# Patient Record
Sex: Male | Born: 1955
Health system: Southern US, Community
[De-identification: ages and names within clinical notes are randomized; demographics above are authoritative.]

## PROBLEM LIST (undated history)

## (undated) DIAGNOSIS — I10 Essential (primary) hypertension: Secondary | ICD-10-CM

## (undated) DIAGNOSIS — M1990 Unspecified osteoarthritis, unspecified site: Secondary | ICD-10-CM

## (undated) HISTORY — DX: Unspecified osteoarthritis, unspecified site: M19.90

## (undated) HISTORY — DX: Essential (primary) hypertension: I10

---
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
## 2006-09-13 ENCOUNTER — Inpatient Hospital Stay: Payer: Self-pay | Admitting: Internal Medicine

## 2008-07-10 ENCOUNTER — Ambulatory Visit: Payer: Self-pay | Admitting: Internal Medicine

## 2012-02-23 HISTORY — PX: COLONOSCOPY: SHX5424

## 2014-10-07 ENCOUNTER — Other Ambulatory Visit: Payer: Self-pay

## 2014-11-30 ENCOUNTER — Other Ambulatory Visit: Payer: Self-pay | Admitting: Family Medicine

## 2015-04-04 ENCOUNTER — Encounter: Payer: Self-pay | Admitting: Family Medicine

## 2015-04-04 ENCOUNTER — Ambulatory Visit (INDEPENDENT_AMBULATORY_CARE_PROVIDER_SITE_OTHER): Payer: Self-pay | Admitting: Family Medicine

## 2015-04-04 VITALS — BP 138/88 | HR 76 | Ht 68.0 in | Wt 263.0 lb

## 2015-04-04 DIAGNOSIS — I10 Essential (primary) hypertension: Secondary | ICD-10-CM

## 2015-04-04 DIAGNOSIS — M150 Primary generalized (osteo)arthritis: Secondary | ICD-10-CM

## 2015-04-04 DIAGNOSIS — M159 Polyosteoarthritis, unspecified: Secondary | ICD-10-CM

## 2015-04-04 MED ORDER — METOPROLOL SUCCINATE ER 50 MG PO TB24: ORAL_TABLET | ORAL | Status: DC

## 2015-04-04 MED ORDER — MELOXICAM 7.5 MG PO TABS: 7.5000 mg | ORAL_TABLET | Freq: Every day | ORAL | Status: DC

## 2015-04-04 NOTE — Progress Notes (Signed)
Name: Brandon Perez   MRN: [id number]    DOB: [birth year]   Date:04/04/2015       Progress Note  Subjective  Chief Complaint  Chief Complaint  Patient presents with  . Hypertension  . Arthritis    told by ortho- has arthritis in knees- refill Meloxicam    Hypertension This is a chronic problem. The current episode started more than 1 year ago. The problem has been waxing and waning since onset. The problem is controlled. Pertinent negatives include no anxiety, blurred vision, chest pain, headaches, malaise/fatigue, neck pain, orthopnea, palpitations, peripheral edema, PND, shortness of breath or sweats. There are no associated agents to hypertension. There are no known risk factors for coronary artery disease. Past treatments include beta blockers. The current treatment provides no improvement. There are no compliance problems.  There is no history of angina, kidney disease, CAD/MI, CVA, heart failure, left ventricular hypertrophy, PVD, renovascular disease or retinopathy. There is no history of chronic renal disease or a hypertension causing med.  Arthritis Presents for follow-up visit. He complains of pain. He reports no stiffness, joint swelling or joint warmth. The symptoms have been improving. Affected locations include the left knee and right knee. His pain is at a severity of 5/10. Pertinent negatives include no diarrhea, dry eyes, dry mouth, dysuria, fatigue, fever, pain at night, pain while resting, rash, Raynaud's syndrome, uveitis or weight loss.    No problem-specific assessment & plan notes found for this encounter.   Past Medical History  Diagnosis Date  . Hypertension   . Arthritis     Past Surgical History  Procedure Laterality Date  . Colonoscopy  02/23/2012    normal    Family History  Problem Relation Age of Onset  . Heart disease Mother   . Heart disease Father   . Cancer Sister   . Hypertension Brother     Social History   Social History  . Marital  Status: Married    Spouse Name: N/A  . Number of Children: N/A  . Years of Education: N/A   Occupational History  . Not on file.   Social History Main Topics  . Smoking status: Current Every Day Smoker  . Smokeless tobacco: Not on file  . Alcohol Use: No  . Drug Use: No  . Sexual Activity: Yes   Other Topics Concern  . Not on file   Social History Narrative  . No narrative on file    Allergies  Allergen Reactions  . Penicillin G Other (See Comments)    As a child     Review of Systems  Constitutional: Negative for fever, chills, weight loss, malaise/fatigue and fatigue.  HENT: Negative for ear discharge, ear pain and sore throat.   Eyes: Negative for blurred vision.  Respiratory: Negative for cough, sputum production, shortness of breath and wheezing.   Cardiovascular: Negative for chest pain, palpitations, orthopnea, leg swelling and PND.  Gastrointestinal: Negative for heartburn, nausea, abdominal pain, diarrhea, constipation, blood in stool and melena.  Genitourinary: Negative for dysuria, urgency, frequency and hematuria.  Musculoskeletal: Positive for arthritis. Negative for myalgias, back pain, joint pain, joint swelling, stiffness and neck pain.  Skin: Negative for rash.  Neurological: Negative for dizziness, tingling, sensory change, focal weakness and headaches.  Endo/Heme/Allergies: Negative for environmental allergies and polydipsia. Does not bruise/bleed easily.  Psychiatric/Behavioral: Negative for depression and suicidal ideas. The patient is not nervous/anxious and does not have insomnia.      Objective  Filed Vitals:  04/04/15 0902  BP: 138/88  Pulse: 76  Height:  (1.727 m)  Weight: 263 lb (119.296 kg)    Physical Exam  Constitutional: He is oriented to person, place, and time and well-developed, well-nourished, and in no distress.  HENT:  Head: Normocephalic.  Right Ear: External ear normal.  Left Ear: External ear normal.  Nose:  Nose normal.  Mouth/Throat: Oropharynx is clear and moist.  Eyes: Conjunctivae and EOM are normal. Pupils are equal, round, and reactive to light. Right eye exhibits no discharge. Left eye exhibits no discharge. No scleral icterus.  Neck: Normal range of motion. Neck supple. No JVD present. No tracheal deviation present. No thyromegaly present.  Cardiovascular: Normal rate, regular rhythm, normal heart sounds and intact distal pulses.  Exam reveals no gallop and no friction rub.   No murmur heard. Pulmonary/Chest: Breath sounds normal. No respiratory distress. He has no wheezes. He has no rales.  Abdominal: Soft. Bowel sounds are normal. He exhibits no mass. There is no hepatosplenomegaly. There is no tenderness. There is no rebound, no guarding and no CVA tenderness.  Musculoskeletal: Normal range of motion. He exhibits no edema or tenderness.  Lymphadenopathy:    He has no cervical adenopathy.  Neurological: He is alert and oriented to person, place, and time. He has normal sensation, normal strength, normal reflexes and intact cranial nerves. No cranial nerve deficit.  Skin: Skin is warm. No rash noted.  Psychiatric: Mood and affect normal.  Nursing note and vitals reviewed.     Assessment & Plan  Problem List Items Addressed This Visit    None    Visit Diagnoses    Essential hypertension    -  Primary    Relevant Medications    aspirin 81 MG tablet    metoprolol succinate (TOPROL-XL) 50 MG 24 hr tablet    Primary osteoarthritis involving multiple joints        Relevant Medications    aspirin 81 MG tablet    meloxicam (MOBIC) 7.5 MG tablet         Dr. Hayden [facility]  04/04/2015

## 2016-03-29 ENCOUNTER — Encounter: Payer: Self-pay | Admitting: Family Medicine

## 2016-03-29 ENCOUNTER — Ambulatory Visit (INDEPENDENT_AMBULATORY_CARE_PROVIDER_SITE_OTHER): Payer: Self-pay | Admitting: Family Medicine

## 2016-03-29 VITALS — BP 130/88 | HR 80 | Ht 68.0 in | Wt 271.0 lb

## 2016-03-29 DIAGNOSIS — I10 Essential (primary) hypertension: Secondary | ICD-10-CM

## 2016-03-29 MED ORDER — METOPROLOL SUCCINATE ER 50 MG PO TB24: ORAL_TABLET | ORAL | 3 refills | Status: DC

## 2016-03-29 NOTE — Progress Notes (Signed)
Name: Brandon Perez   MRN: [id number]    DOB: 07/18/1955   Date:03/29/2016       Progress Note  Subjective  Chief Complaint  Chief Complaint  Patient presents with  . Hypertension    Hypertension  This is a chronic problem. The current episode started more than 1 year ago. The problem has been waxing and waning since onset. The problem is controlled. Pertinent negatives include no anxiety, blurred vision, chest pain, headaches, malaise/fatigue, neck pain, orthopnea, palpitations, peripheral edema, PND, shortness of breath or sweats. There are no associated agents to hypertension.    No problem-specific Assessment & Plan notes found for this encounter.   Past Medical History:  Diagnosis Date  . Arthritis   . Hypertension     Past Surgical History:  Procedure Laterality Date  . COLONOSCOPY  02/23/2012   normal    Family History  Problem Relation Age of Onset  . Heart disease Mother   . Heart disease Father   . Cancer Sister   . Hypertension Brother     Social History   Social History  . Marital status: Married    Spouse name: N/A  . Number of children: N/A  . Years of education: N/A   Occupational History  . Not on file.   Social History Main Topics  . Smoking status: Current Every Day Smoker  . Smokeless tobacco: Not on file  . Alcohol use No  . Drug use: No  . Sexual activity: Yes   Other Topics Concern  . Not on file   Social History Narrative  . No narrative on file    Allergies  Allergen Reactions  . Penicillin G Other (See Comments)    As a child     Review of Systems  Constitutional: Negative for chills, fever, malaise/fatigue and weight loss.  HENT: Negative for ear discharge, ear pain and sore throat.   Eyes: Negative for blurred vision.  Respiratory: Negative for cough, sputum production, shortness of breath and wheezing.   Cardiovascular: Negative for chest pain, palpitations, orthopnea, leg swelling and PND.  Gastrointestinal: Negative  for abdominal pain, blood in stool, constipation, diarrhea, heartburn, melena and nausea.  Genitourinary: Negative for dysuria, frequency, hematuria and urgency.  Musculoskeletal: Negative for back pain, joint pain, myalgias and neck pain.  Skin: Negative for rash.  Neurological: Negative for dizziness, tingling, sensory change, focal weakness and headaches.  Endo/Heme/Allergies: Negative for environmental allergies and polydipsia. Does not bruise/bleed easily.  Psychiatric/Behavioral: Negative for depression and suicidal ideas. The patient is not nervous/anxious and does not have insomnia.      Objective  Vitals:   03/29/16 0903  BP: 130/88  Pulse: 80  Weight: 271 lb (122.9 kg)  Height: 5\' 8"  (1.727 m)    Physical Exam  Constitutional: He is oriented to person, place, and time and well-developed, well-nourished, and in no distress.  HENT:  Head: Normocephalic.  Right Ear: External ear normal.  Left Ear: External ear normal.  Nose: Nose normal.  Mouth/Throat: Oropharynx is clear and moist.  Eyes: Conjunctivae and EOM are normal. Pupils are equal, round, and reactive to light. Right eye exhibits no discharge. Left eye exhibits no discharge. No scleral icterus.  Neck: Normal range of motion. Neck supple. No JVD present. No tracheal deviation present. No thyromegaly present.  Cardiovascular: Normal rate, regular rhythm, normal heart sounds and intact distal pulses.  Exam reveals no gallop and no friction rub.   No murmur heard. Pulmonary/Chest: Breath sounds normal. No  respiratory distress. He has no wheezes. He has no rales.  Abdominal: Soft. Bowel sounds are normal. He exhibits no mass. There is no hepatosplenomegaly. There is no tenderness. There is no rebound, no guarding and no CVA tenderness.  Musculoskeletal: Normal range of motion. He exhibits no edema or tenderness.  Lymphadenopathy:    He has no cervical adenopathy.  Neurological: He is alert and oriented to person, place,  and time. He has normal sensation, normal strength, normal reflexes and intact cranial nerves. No cranial nerve deficit.  Skin: Skin is warm. No rash noted.  Psychiatric: Mood and affect normal.      Assessment & Plan  Problem List Items Addressed This Visit    None    Visit Diagnoses    Essential hypertension    -  Primary   Relevant Medications   metoprolol succinate (TOPROL-XL) 50 MG 24 hr tablet   Other Relevant Orders   Renal Function Panel        Dr. Elizabeth Sauer [facility]  03/29/16

## 2018-09-08 ENCOUNTER — Other Ambulatory Visit: Payer: Self-pay

## 2018-09-08 ENCOUNTER — Ambulatory Visit (INDEPENDENT_AMBULATORY_CARE_PROVIDER_SITE_OTHER): Payer: Medicare PPO | Admitting: Family Medicine

## 2018-09-08 ENCOUNTER — Encounter: Payer: Self-pay | Admitting: Family Medicine

## 2018-09-08 VITALS — BP 134/76 | HR 65 | Ht 68.0 in | Wt 263.0 lb

## 2018-09-08 DIAGNOSIS — I10 Essential (primary) hypertension: Secondary | ICD-10-CM | POA: Diagnosis not present

## 2018-09-08 MED ORDER — LISINOPRIL 10 MG PO TABS: 10.0000 mg | ORAL_TABLET | Freq: Every day | ORAL | 1 refills | Status: DC

## 2018-09-08 NOTE — Progress Notes (Signed)
Date:  09/08/2018   Name:  Brandon Perez   DOB:  07-Dec-1955   MRN:  [id number]   Chief Complaint: Hypertension  Hypertension This is a chronic problem. The current episode started more than 1 year ago. The problem is controlled. Pertinent negatives include no anxiety, blurred vision, chest pain, headaches, malaise/fatigue, neck pain, orthopnea, palpitations, peripheral edema, PND, shortness of breath or sweats. There are no associated agents to hypertension. Risk factors for coronary artery disease include obesity and smoking/tobacco exposure. Past treatments include beta blockers. The current treatment provides moderate improvement. There are no compliance problems.  There is no history of angina, kidney disease, CAD/MI, CVA, heart failure, left ventricular hypertrophy, PVD or retinopathy. There is no history of chronic renal disease, a hypertension causing med or renovascular disease.    Review of Systems  Constitutional: Negative for chills, fever and malaise/fatigue.  HENT: Negative for drooling, ear discharge, ear pain and sore throat.   Eyes: Negative for blurred vision.  Respiratory: Negative for cough, shortness of breath and wheezing.   Cardiovascular: Negative for chest pain, palpitations, orthopnea, leg swelling and PND.  Gastrointestinal: Negative for abdominal pain, blood in stool, constipation, diarrhea and nausea.  Endocrine: Negative for polydipsia.  Genitourinary: Negative for dysuria, frequency, hematuria and urgency.  Musculoskeletal: Negative for back pain, myalgias and neck pain.  Skin: Negative for rash.  Allergic/Immunologic: Negative for environmental allergies.  Neurological: Negative for dizziness and headaches.  Hematological: Does not bruise/bleed easily.  Psychiatric/Behavioral: Negative for suicidal ideas. The patient is not nervous/anxious.     There are no active problems to display for this patient.   Allergies  Allergen Reactions  . Penicillin G  Other (See Comments)    As a child    Past Surgical History:  Procedure Laterality Date  . COLONOSCOPY  02/23/2012   normal    Social History   Tobacco Use  . Smoking status: Current Every Day Smoker  . Smokeless tobacco: Never Used  Substance Use Topics  . Alcohol use: No    Alcohol/week: 0.0 standard drinks  . Drug use: No     Medication list has been reviewed and updated.  Current Meds  Medication Sig  . aspirin 81 MG tablet Take 81 mg by mouth daily.  . metoprolol succinate (TOPROL-XL) 50 MG 24 hr tablet Take 1 tablet by mouth  every day    PHQ 2/9 Scores 09/08/2018 04/04/2015  PHQ - 2 Score 0 0  PHQ- 9 Score 0 -    BP Readings from Last 3 Encounters:  09/08/18 134/76  03/29/16 130/88  04/04/15 138/88    Physical Exam Vitals signs and nursing note reviewed.  HENT:     Head: Normocephalic.     Right Ear: External ear normal.     Left Ear: External ear normal.     Nose: Nose normal.  Eyes:     General: No scleral icterus.       Right eye: No discharge.        Left eye: No discharge.     Conjunctiva/sclera: Conjunctivae normal.     Pupils: Pupils are equal, round, and reactive to light.  Neck:     Musculoskeletal: Normal range of motion and neck supple.     Thyroid: No thyromegaly.     Vascular: No JVD.     Trachea: No tracheal deviation.  Cardiovascular:     Rate and Rhythm: Normal rate and regular rhythm.     Heart sounds: Normal heart  sounds. No murmur. No friction rub. No gallop.   Pulmonary:     Effort: No respiratory distress.     Breath sounds: Normal breath sounds. No wheezing or rales.  Abdominal:     General: Bowel sounds are normal.     Palpations: Abdomen is soft. There is no mass.     Tenderness: There is no abdominal tenderness. There is no guarding or rebound.  Musculoskeletal: Normal range of motion.        General: No tenderness.  Lymphadenopathy:     Cervical: No cervical adenopathy.  Skin:    General: Skin is warm.      Findings: No rash.  Neurological:     Mental Status: He is alert and oriented to person, place, and time.     Cranial Nerves: No cranial nerve deficit.     Deep Tendon Reflexes: Reflexes are normal and symmetric.     Wt Readings from Last 3 Encounters:  09/08/18 263 lb (119.3 kg)  03/29/16 271 lb (122.9 kg)  04/04/15 263 lb (119.3 kg)    BP 134/76   Pulse 65   Ht 5\' 8"  (1.727 m)   Wt 263 lb (119.3 kg)   SpO2 93%   BMI 39.99 kg/m   Assessment and Plan: 1. Essential hypertension Chronic.  Controlled.  But patient has not felt well on current dosing of metoprolol and has resorted to taking it every other day because of that.  We will discontinue metoprolol and replace it with lisinopril 10 mg.  Will recheck patient in 6 months in the meantime we will check a renal function panel. - Renal Function Panel - lisinopril (ZESTRIL) 10 MG tablet; Take 1 tablet (10 mg total) by mouth daily.  Dispense: 90 tablet; Refill: 1

## 2018-09-08 NOTE — Patient Instructions (Signed)

## 2018-09-09 LAB — RENAL FUNCTION PANEL
Albumin: 4.1 g/dL (ref 3.8–4.8)
BUN/Creatinine Ratio: 14 (ref 10–24)
BUN: 14 mg/dL (ref 8–27)
CO2: 22 mmol/L (ref 20–29)
Calcium: 9.1 mg/dL (ref 8.6–10.2)
Chloride: 100 mmol/L (ref 96–106)
Creatinine, Ser: 1 mg/dL (ref 0.76–1.27)
GFR calc Af Amer: 91 mL/min/{1.73_m2} (ref >59)
GFR calc non Af Amer: 79 mL/min/{1.73_m2} (ref >59)
Glucose: 81 mg/dL (ref 65–99)
Phosphorus: 3.5 mg/dL (ref 2.8–4.1)
Potassium: 4.4 mmol/L (ref 3.5–5.2)
Sodium: 139 mmol/L (ref 134–144)

## 2019-03-14 ENCOUNTER — Encounter: Payer: Self-pay | Admitting: Family Medicine

## 2019-03-14 ENCOUNTER — Ambulatory Visit: Payer: Medicare PPO | Admitting: Family Medicine

## 2019-03-14 ENCOUNTER — Other Ambulatory Visit: Payer: Self-pay

## 2019-03-14 VITALS — BP 120/80 | HR 72 | Ht 68.0 in | Wt 257.0 lb

## 2019-03-14 DIAGNOSIS — Z23 Encounter for immunization: Secondary | ICD-10-CM | POA: Diagnosis not present

## 2019-03-14 DIAGNOSIS — I10 Essential (primary) hypertension: Secondary | ICD-10-CM | POA: Diagnosis not present

## 2019-03-14 DIAGNOSIS — E6609 Other obesity due to excess calories: Secondary | ICD-10-CM

## 2019-03-14 DIAGNOSIS — Z6839 Body mass index (BMI) 39.0-39.9, adult: Secondary | ICD-10-CM

## 2019-03-14 MED ORDER — LISINOPRIL 10 MG PO TABS: 10.0000 mg | ORAL_TABLET | Freq: Every day | ORAL | 1 refills | Status: DC

## 2019-03-14 NOTE — Patient Instructions (Signed)

## 2019-03-14 NOTE — Progress Notes (Signed)
Date:  03/14/2019   Name:  Brandon Perez   DOB:  20-Jan-1956   MRN:  [id number]   Chief Complaint: Hypertension and Flu Vaccine  Hypertension This is a chronic problem. The current episode started more than 1 year ago. The problem has been gradually improving since onset. The problem is controlled. Pertinent negatives include no anxiety, blurred vision, chest pain, headaches, malaise/fatigue, neck pain, orthopnea, palpitations, peripheral edema, PND, shortness of breath or sweats. There are no associated agents to hypertension. Risk factors for coronary artery disease include obesity. Past treatments include ACE inhibitors. The current treatment provides moderate improvement. There are no compliance problems.  There is no history of angina, kidney disease, CAD/MI, CVA, heart failure, left ventricular hypertrophy, PVD or retinopathy. There is no history of chronic renal disease, a hypertension causing med or renovascular disease.    Lab Results  Component Value Date   CREATININE 1.00 09/08/2018   BUN 14 09/08/2018   NA 139 09/08/2018   K 4.4 09/08/2018   CL 100 09/08/2018   CO2 22 09/08/2018   No results found for: CHOL, HDL, LDLCALC, LDLDIRECT, TRIG, CHOLHDL No results found for: TSH No results found for: HGBA1C   Review of Systems  Constitutional: Negative for chills, fever and malaise/fatigue.  HENT: Negative for drooling, ear discharge, ear pain and sore throat.   Eyes: Negative for blurred vision.  Respiratory: Negative for cough, shortness of breath and wheezing.   Cardiovascular: Negative for chest pain, palpitations, orthopnea, leg swelling and PND.  Gastrointestinal: Negative for abdominal pain, blood in stool, constipation, diarrhea and nausea.  Endocrine: Negative for polydipsia.  Genitourinary: Negative for decreased urine volume, difficulty urinating, dysuria, frequency, hematuria and urgency.  Musculoskeletal: Negative for back pain, myalgias and neck pain.  Skin:  Negative for rash.  Allergic/Immunologic: Negative for environmental allergies.  Neurological: Negative for dizziness and headaches.  Hematological: Does not bruise/bleed easily.  Psychiatric/Behavioral: Negative for suicidal ideas. The patient is not nervous/anxious.     There are no problems to display for this patient.   Allergies  Allergen Reactions  . Penicillin G Other (See Comments)    As a child    Past Surgical History:  Procedure Laterality Date  . COLONOSCOPY  02/23/2012   normal    Social History   Tobacco Use  . Smoking status: Current Every Day Smoker  . Smokeless tobacco: Never Used  Substance Use Topics  . Alcohol use: No    Alcohol/week: 0.0 standard drinks  . Drug use: No     Medication list has been reviewed and updated.  Current Meds  Medication Sig  . aspirin 81 MG tablet Take 81 mg by mouth daily.  Marland Kitchen lisinopril (ZESTRIL) 10 MG tablet Take 1 tablet (10 mg total) by mouth daily.    PHQ 2/9 Scores 03/14/2019 09/08/2018 04/04/2015  PHQ - 2 Score 0 0 0  PHQ- 9 Score 0 0 -    BP Readings from Last 3 Encounters:  03/14/19 120/80  09/08/18 134/76  03/29/16 130/88    Physical Exam Vitals and nursing note reviewed.  HENT:     Head: Normocephalic.     Right Ear: Tympanic membrane, ear canal and external ear normal.     Left Ear: Tympanic membrane, ear canal and external ear normal.     Nose: Nose normal. No congestion or rhinorrhea.  Eyes:     General: No scleral icterus.       Right eye: No discharge.  Left eye: No discharge.     Conjunctiva/sclera: Conjunctivae normal.     Pupils: Pupils are equal, round, and reactive to light.  Neck:     Thyroid: No thyromegaly.     Vascular: No JVD.     Trachea: No tracheal deviation.  Cardiovascular:     Rate and Rhythm: Normal rate and regular rhythm.     Heart sounds: Normal heart sounds. No murmur. No friction rub. No gallop.   Pulmonary:     Effort: No respiratory distress.     Breath  sounds: Normal breath sounds. No wheezing, rhonchi or rales.  Abdominal:     General: Bowel sounds are normal.     Palpations: Abdomen is soft. There is no mass.     Tenderness: There is no abdominal tenderness. There is no guarding or rebound.  Musculoskeletal:        General: No tenderness. Normal range of motion.     Cervical back: Normal range of motion and neck supple.  Lymphadenopathy:     Cervical: No cervical adenopathy.  Skin:    General: Skin is warm.     Findings: No rash.  Neurological:     Mental Status: He is alert and oriented to person, place, and time.     Cranial Nerves: No cranial nerve deficit.     Deep Tendon Reflexes: Reflexes are normal and symmetric.     Wt Readings from Last 3 Encounters:  03/14/19 257 lb (116.6 kg)  09/08/18 263 lb (119.3 kg)  03/29/16 271 lb (122.9 kg)    BP 120/80   Pulse 72   Ht 5\' 8"  (1.727 m)   Wt 257 lb (116.6 kg)   BMI 39.08 kg/m   Assessment and Plan:  1. Essential hypertension Chronic.  Controlled.  Stable.  Continue lisinopril 10 mg once a day.  Will check renal function panel to assess GFR.  Will recheck patient in 6 months. - Renal Function Panel - lisinopril (ZESTRIL) 10 MG tablet; Take 1 tablet (10 mg total) by mouth daily.  Dispense: 90 tablet; Refill: 1  2. Class 2 obesity due to excess calories without serious comorbidity with body mass index (BMI) of 39.0 to 39.9 in adult Chronic.  Uncontrolled.  Stable.  Patient has not lost weight in the past 6 months. Health risks of being over weight were discussed and patient was counseled on weight loss options and exercise. Will obtain a lipid panel to see if there is any hyperlipidemia of concern.  In the meantime patient has been encouraged to lose weight with a [facility] diet sheet. - Lipid Panel With LDL/HDL Ratio  3. Influenza vaccine needed Discussed and administered. - Flu Vaccine QUAD High Dose(Fluad)

## 2019-03-15 LAB — RENAL FUNCTION PANEL
Albumin: 4.3 g/dL (ref 3.8–4.8)
BUN/Creatinine Ratio: 14 (ref 10–24)
BUN: 15 mg/dL (ref 8–27)
CO2: 24 mmol/L (ref 20–29)
Calcium: 9.2 mg/dL (ref 8.6–10.2)
Chloride: 100 mmol/L (ref 96–106)
Creatinine, Ser: 1.11 mg/dL (ref 0.76–1.27)
GFR calc Af Amer: 80 mL/min/{1.73_m2} (ref >59)
GFR calc non Af Amer: 69 mL/min/{1.73_m2} (ref >59)
Glucose: 82 mg/dL (ref 65–99)
Phosphorus: 3.1 mg/dL (ref 2.8–4.1)
Potassium: 4.7 mmol/L (ref 3.5–5.2)
Sodium: 137 mmol/L (ref 134–144)

## 2019-03-15 LAB — LIPID PANEL WITH LDL/HDL RATIO
Cholesterol, Total: 201 mg/dL — ABNORMAL HIGH (ref 100–199)
HDL: 33 mg/dL — ABNORMAL LOW (ref >39)
LDL Chol Calc (NIH): 122 mg/dL — ABNORMAL HIGH (ref 0–99)
LDL/HDL Ratio: 3.7 ratio — ABNORMAL HIGH (ref 0.0–3.6)
Triglycerides: 261 mg/dL — ABNORMAL HIGH (ref 0–149)
VLDL Cholesterol Cal: 46 mg/dL — ABNORMAL HIGH (ref 5–40)

## 2019-03-29 DIAGNOSIS — H25013 Cortical age-related cataract, bilateral: Secondary | ICD-10-CM | POA: Diagnosis not present

## 2019-11-28 ENCOUNTER — Ambulatory Visit (INDEPENDENT_AMBULATORY_CARE_PROVIDER_SITE_OTHER): Payer: Medicare PPO

## 2019-11-28 DIAGNOSIS — Z0000 Encounter for general adult medical examination without abnormal findings: Secondary | ICD-10-CM | POA: Diagnosis not present

## 2019-11-28 NOTE — Progress Notes (Signed)
Subjective:   Brandon Perez is a 64 y.o. male who presents for an Initial Medicare Annual Wellness Visit.  Virtual Visit via Telephone Note  I connected with  Brandon Perez on 11/28/19 at  2:00 PM EDT by telephone and verified that I am speaking with the correct person using two identifiers.  Medicare Annual Wellness visit completed telephonically due to Covid-19 pandemic.   Location: Patient: home Provider: [facility]   I discussed the limitations, risks, security and privacy concerns of performing an evaluation and management service by telephone and the availability of in person appointments. The patient expressed understanding and agreed to proceed.  Unable to perform video visit due to video visit attempted and failed and/or patient does not have video capability.   Some vital signs may be absent or patient reported.   Reather Littler, LPN    Review of Systems     Cardiac Risk Factors include: advanced age (>33men, >44 women);hypertension;male gender;obesity (BMI >30kg/m2)     Objective:    There were no vitals filed for this visit. There is no height or weight on file to calculate BMI.  Advanced Directives 11/28/2019  Does Patient Have a Medical Advance Directive? Yes  Type of Estate agent of [place];Living will  Copy of Healthcare Power of Attorney in Chart? No - copy requested    Current Medications (verified) Outpatient Encounter Medications as of 11/28/2019  Medication Sig  . aspirin 81 MG tablet Take 81 mg by mouth daily.  Marland Kitchen lisinopril (ZESTRIL) 10 MG tablet Take 1 tablet (10 mg total) by mouth daily.   No facility-administered encounter medications on file as of 11/28/2019.    Allergies (verified) Penicillin g   History: Past Medical History:  Diagnosis Date  . Arthritis   . Hypertension    Past Surgical History:  Procedure Laterality Date  . COLONOSCOPY  02/23/2012   normal   Family History  Problem Relation Age of Onset  . Heart  disease Mother   . Heart disease Father   . Cancer Sister   . Hypertension Brother    Social History   Socioeconomic History  . Marital status: Married    Spouse name: Not on file  . Number of children: 2  . Years of education: Not on file  . Highest education level: Not on file  Occupational History  . Not on file  Tobacco Use  . Smoking status: Former Smoker    Packs/day: 1.00    Years: 30.00    Pack years: 30.00    Types: Cigarettes    Quit date: 05/24/2019    Years since quitting: 0.5  . Smokeless tobacco: Never Used  Vaping Use  . Vaping Use: Never used  Substance and Sexual Activity  . Alcohol use: No    Alcohol/week: 0.0 standard drinks  . Drug use: No  . Sexual activity: Yes  Other Topics Concern  . Not on file  Social History Narrative  . Not on file   Social Determinants of Health   Financial Resource Strain: Low Risk   . Difficulty of Paying Living Expenses: Not hard at all  Food Insecurity: No Food Insecurity  . Worried About Programme researcher, broadcasting/film/video in the Last Year: Never true  . Ran Out of Food in the Last Year: Never true  Transportation Needs: No Transportation Needs  . Lack of Transportation (Medical): No  . Lack of Transportation (Non-Medical): No  Physical Activity: Inactive  . Days of Exercise per Week: 0  days  . Minutes of Exercise per Session: 0 min  Stress: No Stress Concern Present  . Feeling of Stress : Not at all  Social Connections: Moderately Isolated  . Frequency of Communication with Friends and Family: More than three times a week  . Frequency of Social Gatherings with Friends and Family: More than three times a week  . Attends Religious Services: Never  . Active Member of Clubs or Organizations: No  . Attends Banker Meetings: Never  . Marital Status: Married    Tobacco Counseling Counseling given: Not Answered   Clinical Intake:  Pre-visit preparation completed: Yes  Pain : No/denies pain     Nutritional  Risks: None Diabetes: No  How often do you need to have someone help you when you read instructions, pamphlets, or other written materials from your doctor or pharmacy?: 1 - Never    Interpreter Needed?: No  Information entered by :: Reather Littler LPN   Activities of Daily Living In your present state of health, do you have any difficulty performing the following activities: 11/28/2019  Hearing? N  Comment declines hearing aids  Vision? N  Difficulty concentrating or making decisions? N  Walking or climbing stairs? N  Dressing or bathing? N  Doing errands, shopping? N  Preparing Food and eating ? N  Using the Toilet? N  In the past six months, have you accidently leaked urine? N  Do you have problems with loss of bowel control? N  Managing your Medications? N  Managing your Finances? N  Housekeeping or managing your Housekeeping? N  Some recent data might be hidden    Patient Care Team: Duanne Limerick, MD as PCP - General (Family Medicine)  Indicate any recent Medical Services you may have received from other than [facility] providers in the past year (date may be approximate).     Assessment:   This is a routine wellness examination for Brandon Perez.  Hearing/Vision screen  Hearing Screening   125Hz  250Hz  500Hz  1000Hz  2000Hz  3000Hz  4000Hz  6000Hz  8000Hz   Right ear:           Left ear:           Comments: Pt denies hearing difficulty  Vision Screening Comments: Annual vision screenings done at [facility] in [address]  Dietary issues and exercise activities discussed: Current Exercise Habits: The patient does not participate in regular exercise at present, Exercise limited by: None identified  Goals    . DIET - INCREASE WATER INTAKE     Recommend drinking 6-8 glasses of water per day       Depression Screen PHQ 2/9 Scores 11/28/2019 03/14/2019 09/08/2018 04/04/2015  PHQ - 2 Score 0 0 0 0  PHQ- 9 Score - 0 0 -    Fall Risk Fall Risk  11/28/2019 03/14/2019 04/04/2015    Falls in the past year? 0 0 No  Number falls in past yr: 0 - -  Injury with Fall? 0 - -  Risk for fall due to : No Fall Risks - -  Follow up Falls prevention discussed Falls evaluation completed -    Any stairs in or around the home? Yes  If so, are there any without handrails? No  Home free of loose throw rugs in walkways, pet beds, electrical cords, etc? Yes  Adequate lighting in your home to reduce risk of falls? Yes   ASSISTIVE DEVICES UTILIZED TO PREVENT FALLS:  Life alert? No  Use of a cane, walker or  w/c? No  Grab bars in the bathroom? No  Shower chair or bench in shower? No  Elevated toilet seat or a handicapped toilet? Yes   TIMED UP AND GO:  Was the test performed? No . Telephonic visit.   Cognitive Function:     6CIT Screen 11/28/2019  What Year? 0 points  What month? 0 points  What time? 0 points  Count back from 20 0 points  Months in reverse 0 points  Repeat phrase 2 points  Total Score 2    Immunizations Immunization History  Administered Date(s) Administered  . Fluad Quad(high Dose 65+) 03/14/2019    TDAP status: Due, Education has been provided regarding the importance of this vaccine. Advised may receive this vaccine at local pharmacy or Health Dept. Aware to provide a copy of the vaccination record if obtained from local pharmacy or Health Dept. Verbalized acceptance and understanding.   Flu Vaccine status: Declined, Education has been provided regarding the importance of this vaccine but patient still declined. Advised may receive this vaccine at local pharmacy or Health Dept. Aware to provide a copy of the vaccination record if obtained from local pharmacy or Health Dept. Verbalized acceptance and understanding.   Pneumococcal vaccine status: Declined,  Education has been provided regarding the importance of this vaccine but patient still declined. Advised may receive this vaccine at local pharmacy or Health Dept. Aware to provide a copy of the  vaccination record if obtained from local pharmacy or Health Dept. Verbalized acceptance and understanding.    Covid-19 vaccine status: Completed vaccines. Pt advised to bring vaccination record to next appt.   Qualifies for Shingles Vaccine? Yes   Zostavax completed No   Shingrix Completed?: No.    Education has been provided regarding the importance of this vaccine. Patient has been advised to call insurance company to determine out of pocket expense if they have not yet received this vaccine. Advised may also receive vaccine at local pharmacy or Health Dept. Verbalized acceptance and understanding.  Screening Tests Health Maintenance  Topic Date Due  . Hepatitis C Screening  Never done  . COVID-19 Vaccine (1) Never done  . PNA vac Low Risk Adult (1 of 2 - PCV13) Never done  . INFLUENZA VACCINE  09/23/2019  . COLONOSCOPY  02/22/2022  . TETANUS/TDAP  04/03/2025    Health Maintenance  Health Maintenance Due  Topic Date Due  . Hepatitis C Screening  Never done  . COVID-19 Vaccine (1) Never done  . PNA vac Low Risk Adult (1 of 2 - PCV13) Never done  . INFLUENZA VACCINE  09/23/2019    Colorectal cancer screening: Completed 2014. Repeat every 10 years  Lung Cancer Screening: (Low Dose CT Chest recommended if Age 66-80 years, 30 pack-year currently smoking OR have quit w/in 15years.) does qualify.   Lung Cancer Screening Referral: An Epic message has been sent to Glenna FellowsShawn Perkins, RN (Oncology Nurse Navigator) regarding the possible need for this exam. Ines BloomerShawn will review the patient's chart to determine if the patient truly qualifies for the exam. If the patient qualifies, Ines BloomerShawn will order the Low Dose CT of the chest to facilitate the scheduling of this exam.   Additional Screening:  Hepatitis C Screening: does qualify; postponed  Vision Screening: Recommended annual ophthalmology exams for early detection of glaucoma and other disorders of the eye. Is the patient up to date with  their annual eye exam?  Yes  Who is the provider or what is the name of the  office in which the patient attends annual eye exams? [facility] [place]  Dental Screening: Recommended annual dental exams for proper oral hygiene  Community Resource Referral / Chronic Care Management: CRR required this visit?  No   CCM required this visit?  No      Plan:     I have personally reviewed and noted the following in the patient's chart:   . Medical and social history . Use of alcohol, tobacco or illicit drugs  . Current medications and supplements . Functional ability and status . Nutritional status . Physical activity . Advanced directives . List of other physicians . Hospitalizations, surgeries, and ER visits in previous 12 months . Vitals . Screenings to include cognitive, depression, and falls . Referrals and appointments  In addition, I have reviewed and discussed with patient certain preventive protocols, quality metrics, and best practice recommendations. A written personalized care plan for preventive services as well as general preventive health recommendations were provided to patient.     Reather Littler, LPN   [id number]   Nurse Notes: none

## 2019-11-28 NOTE — Patient Instructions (Signed)
Mr. Brandon Perez , Thank you for taking time to come for your Medicare Wellness Visit. I appreciate your ongoing commitment to your health goals. Please review the following plan we discussed and let me know if I can assist you in the future.   Screening recommendations/referrals: Colonoscopy: done 2014. Repeat in 2024.  Recommended yearly ophthalmology/optometry visit for glaucoma screening and checkup Recommended yearly dental visit for hygiene and checkup  Vaccinations: Influenza vaccine: due Pneumococcal vaccine: due Tdap vaccine: due Shingles vaccine: Shingrix discussed. Please contact your pharmacy for coverage information.  Covid-19: please bring your vaccination record with you to your next appt  Advanced directives: Please bring a copy of your health care power of attorney and living will to the office at your convenience.  Conditions/risks identified: Recommend drinking 6-8 glasses of water per day   Next appointment: Follow up in one year for your annual wellness visit.   Preventive Care 17 Years and Older, Male Preventive care refers to lifestyle choices and visits with your health care provider that can promote health and wellness. What does preventive care include?  A yearly physical exam. This is also called an annual well check.  Dental exams once or twice a year.  Routine eye exams. Ask your health care provider how often you should have your eyes checked.  Personal lifestyle choices, including:  Daily care of your teeth and gums.  Regular physical activity.  Eating a healthy diet.  Avoiding tobacco and drug use.  Limiting alcohol use.  Practicing safe sex.  Taking low doses of aspirin every day.  Taking vitamin and mineral supplements as recommended by your health care provider. What happens during an annual well check? The services and screenings done by your health care provider during your annual well check will depend on your age, overall health,  lifestyle risk factors, and family history of disease. Counseling  Your health care provider may ask you questions about your:  Alcohol use.  Tobacco use.  Drug use.  Emotional well-being.  Home and relationship well-being.  Sexual activity.  Eating habits.  History of falls.  Memory and ability to understand (cognition).  Work and work Astronomer. Screening  You may have the following tests or measurements:  Height, weight, and BMI.  Blood pressure.  Lipid and cholesterol levels. These may be checked every 5 years, or more frequently if you are over 16 years old.  Skin check.  Lung cancer screening. You may have this screening every year starting at age 3 if you have a 30-pack-year history of smoking and currently smoke or have quit within the past 15 years.  Fecal occult blood test (FOBT) of the stool. You may have this test every year starting at age 26.  Flexible sigmoidoscopy or colonoscopy. You may have a sigmoidoscopy every 5 years or a colonoscopy every 10 years starting at age 20.  Prostate cancer screening. Recommendations will vary depending on your family history and other risks.  Hepatitis C blood test.  Hepatitis B blood test.  Sexually transmitted disease (STD) testing.  Diabetes screening. This is done by checking your blood sugar (glucose) after you have not eaten for a while (fasting). You may have this done every 1-3 years.  Abdominal aortic aneurysm (AAA) screening. You may need this if you are a current or former smoker.  Osteoporosis. You may be screened starting at age 51 if you are at high risk. Talk with your health care provider about your test results, treatment options, and if necessary,  the need for more tests. Vaccines  Your health care provider may recommend certain vaccines, such as:  Influenza vaccine. This is recommended every year.  Tetanus, diphtheria, and acellular pertussis (Tdap, Td) vaccine. You may need a Td booster  every 10 years.  Zoster vaccine. You may need this after age 46.  Pneumococcal 13-valent conjugate (PCV13) vaccine. One dose is recommended after age 33.  Pneumococcal polysaccharide (PPSV23) vaccine. One dose is recommended after age 23. Talk to your health care provider about which screenings and vaccines you need and how often you need them. This information is not intended to replace advice given to you by your health care provider. Make sure you discuss any questions you have with your health care provider. Document Released: 03/07/2015 Document Revised: 10/29/2015 Document Reviewed: 12/10/2014 Elsevier Interactive Patient Education  2017 [place] Prevention in the Home Falls can cause injuries. They can happen to people of all ages. There are many things you can do to make your home safe and to help prevent falls. What can I do on the outside of my home?  Regularly fix the edges of walkways and driveways and fix any cracks.  Remove anything that might make you trip as you walk through a door, such as a raised step or threshold.  Trim any bushes or trees on the path to your home.  Use bright outdoor lighting.  Clear any walking paths of anything that might make someone trip, such as rocks or tools.  Regularly check to see if handrails are loose or broken. Make sure that both sides of any steps have handrails.  Any raised decks and porches should have guardrails on the edges.  Have any leaves, snow, or ice cleared regularly.  Use sand or salt on walking paths during winter.  Clean up any spills in your garage right away. This includes oil or grease spills. What can I do in the bathroom?  Use night lights.  Install grab bars by the toilet and in the tub and shower. Do not use towel bars as grab bars.  Use non-skid mats or decals in the tub or shower.  If you need to sit down in the shower, use a plastic, non-slip stool.  Keep the floor dry. Clean up any  water that spills on the floor as soon as it happens.  Remove soap buildup in the tub or shower regularly.  Attach bath mats securely with double-sided non-slip rug tape.  Do not have throw rugs and other things on the floor that can make you trip. What can I do in the bedroom?  Use night lights.  Make sure that you have a light by your bed that is easy to reach.  Do not use any sheets or blankets that are too big for your bed. They should not hang down onto the floor.  Have a firm chair that has side arms. You can use this for support while you get dressed.  Do not have throw rugs and other things on the floor that can make you trip. What can I do in the kitchen?  Clean up any spills right away.  Avoid walking on wet floors.  Keep items that you use a lot in easy-to-reach places.  If you need to reach something above you, use a strong step stool that has a grab bar.  Keep electrical cords out of the way.  Do not use floor polish or wax that makes floors slippery. If you must use  wax, use non-skid floor wax.  Do not have throw rugs and other things on the floor that can make you trip. What can I do with my stairs?  Do not leave any items on the stairs.  Make sure that there are handrails on both sides of the stairs and use them. Fix handrails that are broken or loose. Make sure that handrails are as long as the stairways.  Check any carpeting to make sure that it is firmly attached to the stairs. Fix any carpet that is loose or worn.  Avoid having throw rugs at the top or bottom of the stairs. If you do have throw rugs, attach them to the floor with carpet tape.  Make sure that you have a light switch at the top of the stairs and the bottom of the stairs. If you do not have them, ask someone to add them for you. What else can I do to help prevent falls?  Wear shoes that:  Do not have high heels.  Have rubber bottoms.  Are comfortable and fit you well.  Are closed  at the toe. Do not wear sandals.  If you use a stepladder:  Make sure that it is fully opened. Do not climb a closed stepladder.  Make sure that both sides of the stepladder are locked into place.  Ask someone to hold it for you, if possible.  Clearly mark and make sure that you can see:  Any grab bars or handrails.  First and last steps.  Where the edge of each step is.  Use tools that help you move around (mobility aids) if they are needed. These include:  Canes.  Walkers.  Scooters.  Crutches.  Turn on the lights when you go into a dark area. Replace any light bulbs as soon as they burn out.  Set up your furniture so you have a clear path. Avoid moving your furniture around.  If any of your floors are uneven, fix them.  If there are any pets around you, be aware of where they are.  Review your medicines with your doctor. Some medicines can make you feel dizzy. This can increase your chance of falling. Ask your doctor what other things that you can do to help prevent falls. This information is not intended to replace advice given to you by your health care provider. Make sure you discuss any questions you have with your health care provider. Document Released: 12/05/2008 Document Revised: 07/17/2015 Document Reviewed: 03/15/2014 Elsevier Interactive Patient Education  2017 [employer].

## 2019-12-04 ENCOUNTER — Telehealth: Payer: Self-pay

## 2019-12-04 DIAGNOSIS — Z122 Encounter for screening for malignant neoplasm of respiratory organs: Secondary | ICD-10-CM

## 2019-12-04 DIAGNOSIS — Z87891 Personal history of nicotine dependence: Secondary | ICD-10-CM

## 2019-12-04 NOTE — Telephone Encounter (Signed)
Contacted patient for lung CT screening program based on referral from Elizabeth Sauer.  Patient is agreeable and prefers scan in [place].  Glenna Fellows, lung navigator notified of need to schedule scan in [place].  Patient is available anytime outside of Oct 22 to Oct 31 when he will be out of town.  Ines Bloomer will call him with CT appt.  Patient is a former smoker who stopped smoking 5 months ago.  When he smoked, he smoked 1 pack per day.  He started smoking at age 47.

## 2019-12-05 NOTE — Addendum Note (Signed)
Addended by: Jonne Ply on: 12/05/2019 09:59 AM   Modules accepted: Orders

## 2019-12-05 NOTE — Telephone Encounter (Signed)
Former smoker, quit 06/23/19, 51 pack year

## 2019-12-12 ENCOUNTER — Ambulatory Visit: Payer: Medicare PPO

## 2019-12-12 ENCOUNTER — Inpatient Hospital Stay: Payer: Medicare PPO | Admitting: Oncology

## 2020-01-08 ENCOUNTER — Encounter: Payer: Self-pay | Admitting: *Deleted

## 2020-06-17 ENCOUNTER — Other Ambulatory Visit: Payer: Self-pay

## 2020-06-17 ENCOUNTER — Encounter: Payer: Self-pay | Admitting: Family Medicine

## 2020-06-17 ENCOUNTER — Ambulatory Visit: Payer: Medicare PPO | Admitting: Family Medicine

## 2020-06-17 ENCOUNTER — Telehealth: Payer: Self-pay

## 2020-06-17 VITALS — BP 140/90 | HR 80 | Ht 68.0 in | Wt 281.0 lb

## 2020-06-17 DIAGNOSIS — I10 Essential (primary) hypertension: Secondary | ICD-10-CM | POA: Diagnosis not present

## 2020-06-17 DIAGNOSIS — M170 Bilateral primary osteoarthritis of knee: Secondary | ICD-10-CM

## 2020-06-17 DIAGNOSIS — E782 Mixed hyperlipidemia: Secondary | ICD-10-CM | POA: Diagnosis not present

## 2020-06-17 MED ORDER — MELOXICAM 15 MG PO TABS: 15.0000 mg | ORAL_TABLET | Freq: Every day | ORAL | 5 refills | Status: DC

## 2020-06-17 MED ORDER — LISINOPRIL 10 MG PO TABS: 10.0000 mg | ORAL_TABLET | Freq: Every day | ORAL | 1 refills | Status: DC

## 2020-06-17 NOTE — Telephone Encounter (Signed)
Entered in chart.

## 2020-06-17 NOTE — Patient Instructions (Signed)

## 2020-06-17 NOTE — Progress Notes (Signed)
Date:  06/17/2020   Name:  Brandon Perez   DOB:  1955-12-16   MRN:  [id number]   Chief Complaint: Hypertension, Knee Pain (Bilateral knee pain), and Foot Pain (Bilateral foot pain after standing for long periods)  Hypertension This is a chronic problem. The current episode started more than 1 year ago. The problem has been waxing and waning since onset. The problem is controlled. Pertinent negatives include no anxiety, blurred vision, chest pain, headaches, malaise/fatigue, neck pain, orthopnea, palpitations, peripheral edema, PND, shortness of breath or sweats. There are no associated agents to hypertension. Risk factors for coronary artery disease include dyslipidemia. Past treatments include ACE inhibitors. The current treatment provides mild improvement. There are no compliance problems.  There is no history of angina, kidney disease, CAD/MI, CVA, heart failure, left ventricular hypertrophy, PVD or retinopathy. There is no history of chronic renal disease, a hypertension causing med or renovascular disease.  Knee Pain  The incident occurred 3 to 5 days ago. There was no injury mechanism. The pain is present in the left knee and right knee. The quality of the pain is described as aching. The pain is moderate. The pain has been improving since onset. The symptoms are aggravated by movement.  Foot Pain Pertinent negatives include no abdominal pain, chest pain, chills, coughing, fever, headaches, myalgias, nausea, neck pain, rash or sore throat.    Lab Results  Component Value Date   CREATININE 1.11 03/14/2019   BUN 15 03/14/2019   NA 137 03/14/2019   K 4.7 03/14/2019   CL 100 03/14/2019   CO2 24 03/14/2019   Lab Results  Component Value Date   CHOL 201 (H) 03/14/2019   HDL 33 (L) 03/14/2019   LDLCALC 122 (H) 03/14/2019   TRIG 261 (H) 03/14/2019   No results found for: TSH No results found for: HGBA1C No results found for: WBC, HGB, HCT, MCV, PLT No results found for: ALT, AST,  GGT, ALKPHOS, BILITOT   Review of Systems  Constitutional: Negative for chills, fever and malaise/fatigue.  HENT: Negative for drooling, ear discharge, ear pain and sore throat.   Eyes: Negative for blurred vision.  Respiratory: Negative for cough, shortness of breath and wheezing.   Cardiovascular: Negative for chest pain, palpitations, orthopnea, leg swelling and PND.  Gastrointestinal: Negative for abdominal pain, blood in stool, constipation, diarrhea and nausea.  Endocrine: Negative for polydipsia.  Genitourinary: Negative for dysuria, frequency, hematuria and urgency.  Musculoskeletal: Negative for back pain, myalgias and neck pain.  Skin: Negative for rash.  Allergic/Immunologic: Negative for environmental allergies.  Neurological: Negative for dizziness and headaches.  Hematological: Does not bruise/bleed easily.  Psychiatric/Behavioral: Negative for suicidal ideas. The patient is not nervous/anxious.     There are no problems to display for this patient.   Allergies  Allergen Reactions  . Penicillin G Other (See Comments)    As a child    Past Surgical History:  Procedure Laterality Date  . COLONOSCOPY  02/23/2012   normal    Social History   Tobacco Use  . Smoking status: Former Smoker    Packs/day: 1.00    Years: 30.00    Pack years: 30.00    Types: Cigarettes    Quit date: 05/24/2019    Years since quitting: 1.0  . Smokeless tobacco: Never Used  Vaping Use  . Vaping Use: Never used  Substance Use Topics  . Alcohol use: No    Alcohol/week: 0.0 standard drinks  . Drug use: No  Medication list has been reviewed and updated.  Current Meds  Medication Sig  . aspirin 81 MG tablet Take 81 mg by mouth daily.  Marland Kitchen lisinopril (ZESTRIL) 10 MG tablet Take 1 tablet (10 mg total) by mouth daily.    PHQ 2/9 Scores 06/17/2020 11/28/2019 03/14/2019 09/08/2018  PHQ - 2 Score 0 0 0 0  PHQ- 9 Score 0 - 0 0    GAD 7 : Generalized Anxiety Score 06/17/2020 03/14/2019   Nervous, Anxious, on Edge 0 0  Control/stop worrying 0 0  Worry too much - different things 0 0  Trouble relaxing 0 0  Restless 0 0  Easily annoyed or irritable 0 0  Afraid - awful might happen 0 0  Total GAD 7 Score 0 0    BP Readings from Last 3 Encounters:  06/17/20 140/90  03/14/19 120/80  09/08/18 134/76    Physical Exam Vitals and nursing note reviewed.  HENT:     Head: Normocephalic.     Right Ear: Tympanic membrane, ear canal and external ear normal.     Left Ear: Tympanic membrane, ear canal and external ear normal.     Nose: Nose normal. No congestion or rhinorrhea.  Eyes:     General: No scleral icterus.       Right eye: No discharge.        Left eye: No discharge.     Conjunctiva/sclera: Conjunctivae normal.     Pupils: Pupils are equal, round, and reactive to light.  Neck:     Thyroid: No thyromegaly.     Vascular: No JVD.     Trachea: No tracheal deviation.  Cardiovascular:     Rate and Rhythm: Normal rate and regular rhythm.     Heart sounds: Normal heart sounds. No murmur heard. No friction rub. No gallop.   Pulmonary:     Effort: Pulmonary effort is normal. No respiratory distress.     Breath sounds: Normal breath sounds. No wheezing, rhonchi or rales.  Chest:     Chest wall: No tenderness.  Abdominal:     General: Bowel sounds are normal.     Palpations: Abdomen is soft. There is no mass.     Tenderness: There is no abdominal tenderness. There is no guarding or rebound.  Musculoskeletal:        General: No tenderness. Normal range of motion.     Cervical back: Normal range of motion and neck supple.  Lymphadenopathy:     Cervical: No cervical adenopathy.  Skin:    General: Skin is warm.     Findings: No rash.  Neurological:     Mental Status: He is alert and oriented to person, place, and time.     Cranial Nerves: No cranial nerve deficit.     Deep Tendon Reflexes: Reflexes are normal and symmetric.     Wt Readings from Last 3  Encounters:  06/17/20 281 lb (127.5 kg)  03/14/19 257 lb (116.6 kg)  09/08/18 263 lb (119.3 kg)    BP 140/90   Pulse 80   Ht 5\' 8"  (1.727 m)   Wt 281 lb (127.5 kg)   BMI 42.73 kg/m   Assessment and Plan:  1. Essential hypertension Chronic.  Uncontrolled.  Stable.  Blood pressure today is 140/90 I have concerns the patient has not been taking his medication as prescribed.  Is been about 6 months since he should have run out of medication he took it for a few days before but his  bili and acceptable range.  Patient will continue lisinopril 10 mg once a day and we will check a renal function panel.  Patient has been told is very important that he takes his medications on a regular basis. - Renal Function Panel - lisinopril (ZESTRIL) 10 MG tablet; Take 1 tablet (10 mg total) by mouth daily.  Dispense: 90 tablet; Refill: 1  2. Primary osteoarthritis of both knees New onset.  Patient has bilateral osteoarthritis of both knees as told to him by podiatry.  I have prescribed some meloxicam 15 mg once a day for him to take on an as-needed basis that he can add Tylenol to later in the day as needed necessary. - meloxicam (MOBIC) 15 MG tablet; Take 1 tablet (15 mg total) by mouth daily.  Dispense: 30 tablet; Refill: 5  3. Mixed hyperlipidemia Review of patient's previous lipid panel notes elevations of triglycerides LDL VLDL and not HDL.  He does have a risk ratio of this elevated and it has been discussed with him the importance of proper dietary control of his cholesterol.  Patient's been given the [facility] diet sheet guidelines and we will recheck in 6 months. - Lipid Panel With LDL/HDL Ratio  Fortunately patient has quit smoking about a year ago and I am very very proud of that.

## 2020-06-17 NOTE — Telephone Encounter (Unsigned)
Copied from CRM [id number]. Topic: General - Other >> Jun 17, 2020 12:43 PM Gwenlyn Fudge wrote: Reason for CRM: Pt calling to update PCP on vaccine dates. He states that he had the moderna vaccine on 04/09/2019, 05/04/19, 12/25/19,06/17/20. Please advise.

## 2020-06-18 ENCOUNTER — Other Ambulatory Visit: Payer: Self-pay

## 2020-06-18 DIAGNOSIS — E782 Mixed hyperlipidemia: Secondary | ICD-10-CM

## 2020-06-18 LAB — RENAL FUNCTION PANEL
Albumin: 4.3 g/dL (ref 3.8–4.8)
BUN/Creatinine Ratio: 13 (ref 10–24)
BUN: 13 mg/dL (ref 8–27)
CO2: 22 mmol/L (ref 20–29)
Calcium: 9.4 mg/dL (ref 8.6–10.2)
Chloride: 100 mmol/L (ref 96–106)
Creatinine, Ser: 1.04 mg/dL (ref 0.76–1.27)
Glucose: 105 mg/dL — ABNORMAL HIGH (ref 65–99)
Phosphorus: 3.1 mg/dL (ref 2.8–4.1)
Potassium: 4.8 mmol/L (ref 3.5–5.2)
Sodium: 137 mmol/L (ref 134–144)
eGFR: 79 mL/min/{1.73_m2} (ref >59)

## 2020-06-18 LAB — LIPID PANEL WITH LDL/HDL RATIO
Cholesterol, Total: 217 mg/dL — ABNORMAL HIGH (ref 100–199)
HDL: 35 mg/dL — ABNORMAL LOW (ref >39)
LDL Chol Calc (NIH): 136 mg/dL — ABNORMAL HIGH (ref 0–99)
LDL/HDL Ratio: 3.9 ratio — ABNORMAL HIGH (ref 0.0–3.6)
Triglycerides: 257 mg/dL — ABNORMAL HIGH (ref 0–149)
VLDL Cholesterol Cal: 46 mg/dL — ABNORMAL HIGH (ref 5–40)

## 2020-06-18 MED ORDER — ATORVASTATIN CALCIUM 10 MG PO TABS: 10.0000 mg | ORAL_TABLET | Freq: Every day | ORAL | 1 refills | Status: DC

## 2020-06-18 NOTE — Progress Notes (Signed)
Sent in lipitor ?

## 2020-08-14 ENCOUNTER — Other Ambulatory Visit: Payer: Self-pay | Admitting: Family Medicine

## 2020-08-14 DIAGNOSIS — E782 Mixed hyperlipidemia: Secondary | ICD-10-CM

## 2020-09-18 DIAGNOSIS — H268 Other specified cataract: Secondary | ICD-10-CM | POA: Diagnosis not present

## 2020-10-06 DIAGNOSIS — H25013 Cortical age-related cataract, bilateral: Secondary | ICD-10-CM | POA: Diagnosis not present

## 2020-10-08 ENCOUNTER — Other Ambulatory Visit: Payer: Self-pay | Admitting: Family Medicine

## 2020-10-08 DIAGNOSIS — E782 Mixed hyperlipidemia: Secondary | ICD-10-CM

## 2020-10-08 NOTE — Telephone Encounter (Signed)
Requested medication (s) are due for refill today:   Yes  Requested medication (s) are on the active medication list:   Yes  Future visit scheduled:   Yes 12/02/2020   Last ordered: 08/14/2020 #30, 1 refill  Returned because courtesy refill given in June.   Provider to review for refill until appt.   Requested Prescriptions  Pending Prescriptions Disp Refills   atorvastatin (LIPITOR) 10 MG tablet [Pharmacy Med Name: Atorvastatin Calcium 10 MG Oral Tablet] 30 tablet 0    Sig: Take 1 tablet by mouth once daily     Cardiovascular:  Antilipid - Statins Failed - 10/08/2020  1:45 PM      Failed - Total Cholesterol in normal range and within 360 days    Cholesterol, Total  Date Value Ref Range Status  06/17/2020 217 (H) 100 - 199 mg/dL Final          Failed - LDL in normal range and within 360 days    LDL Chol Calc (NIH)  Date Value Ref Range Status  06/17/2020 136 (H) 0 - 99 mg/dL Final          Failed - HDL in normal range and within 360 days    HDL  Date Value Ref Range Status  06/17/2020 35 (L) >39 mg/dL Final          Failed - Triglycerides in normal range and within 360 days    Triglycerides  Date Value Ref Range Status  06/17/2020 257 (H) 0 - 149 mg/dL Final          Passed - Patient is not pregnant      Passed - Valid encounter within last 12 months    Recent Outpatient Visits           3 months ago Essential hypertension   [facility] Duanne Limerick, MD   1 year ago Essential hypertension   [facility] Duanne Limerick, MD   2 years ago Essential hypertension   [facility] Duanne Limerick, MD   4 years ago Essential hypertension   [facility] Duanne Limerick, MD   5 years ago Essential hypertension   [facility] Duanne Limerick, MD       Future Appointments             In 1 month Duanne Limerick, MD [facility], [facility]

## 2020-11-27 ENCOUNTER — Telehealth: Payer: Self-pay

## 2020-11-27 NOTE — Telephone Encounter (Signed)
Copied from CRM [id number]. 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 Pt does not want to participate in program. Please do not sch him again. He will make sure and keep up his visits with PCP cancel 10/10 appt

## 2020-12-01 ENCOUNTER — Ambulatory Visit: Payer: Medicare PPO

## 2020-12-02 ENCOUNTER — Ambulatory Visit: Payer: Medicare PPO | Admitting: Family Medicine

## 2020-12-02 ENCOUNTER — Other Ambulatory Visit: Payer: Self-pay

## 2020-12-02 ENCOUNTER — Encounter: Payer: Self-pay | Admitting: Family Medicine

## 2020-12-02 VITALS — BP 120/76 | HR 72 | Ht 68.0 in | Wt 275.0 lb

## 2020-12-02 DIAGNOSIS — E782 Mixed hyperlipidemia: Secondary | ICD-10-CM | POA: Diagnosis not present

## 2020-12-02 DIAGNOSIS — M170 Bilateral primary osteoarthritis of knee: Secondary | ICD-10-CM

## 2020-12-02 DIAGNOSIS — I10 Essential (primary) hypertension: Secondary | ICD-10-CM

## 2020-12-02 DIAGNOSIS — Z23 Encounter for immunization: Secondary | ICD-10-CM

## 2020-12-02 MED ORDER — LISINOPRIL 10 MG PO TABS: 10.0000 mg | ORAL_TABLET | Freq: Every day | ORAL | 1 refills | Status: DC

## 2020-12-02 MED ORDER — MELOXICAM 15 MG PO TABS: 15.0000 mg | ORAL_TABLET | Freq: Every day | ORAL | 1 refills | Status: DC

## 2020-12-02 MED ORDER — ATORVASTATIN CALCIUM 10 MG PO TABS: 10.0000 mg | ORAL_TABLET | Freq: Every day | ORAL | 1 refills | Status: DC

## 2020-12-02 NOTE — Progress Notes (Signed)
Date:  12/02/2020   Name:  Brandon Perez   DOB:  [birth year]   MRN:  [id number]   Chief Complaint: Flu Vaccine, Hypertension, Hyperlipidemia, and Knee Pain (Takes meloxicam for this)  Hypertension This is a chronic problem. The current episode started more than 1 year ago. The problem has been resolved since onset. The problem is controlled. Pertinent negatives include no anxiety, blurred vision, chest pain, headaches, malaise/fatigue, neck pain, orthopnea, palpitations, peripheral edema, PND or shortness of breath. Risk factors for coronary artery disease include dyslipidemia, obesity, male gender and family history. Past treatments include ACE inhibitors. The current treatment provides significant improvement. Compliance problems include diet and exercise.  There is no history of angina, kidney disease, CAD/MI, CVA, heart failure, left ventricular hypertrophy, PVD or retinopathy. There is no history of chronic renal disease, a hypertension causing med or renovascular disease.  Hyperlipidemia This is a chronic problem. The current episode started more than 1 year ago. The problem is controlled. Recent lipid tests were reviewed and are normal. Exacerbating diseases include obesity. He has no history of chronic renal disease. Pertinent negatives include no chest pain, focal sensory loss, focal weakness, leg pain, myalgias or shortness of breath. Current antihyperlipidemic treatment includes statins. Compliance problems include adherence to diet and adherence to exercise.  Risk factors for coronary artery disease include dyslipidemia, hypertension and obesity.  Knee Pain  There was no injury mechanism. The pain is present in the left knee. The pain is moderate. The pain has been Fluctuating since onset. Pertinent negatives include no inability to bear weight, muscle weakness, numbness or tingling.   Lab Results  Component Value Date   CREATININE 1.04 06/17/2020   BUN 13 06/17/2020   NA 137  06/17/2020   K 4.8 06/17/2020   CL 100 06/17/2020   CO2 22 06/17/2020   Lab Results  Component Value Date   CHOL 217 (H) 06/17/2020   HDL 35 (L) 06/17/2020   LDLCALC 136 (H) 06/17/2020   TRIG 257 (H) 06/17/2020   No results found for: TSH No results found for: HGBA1C No results found for: WBC, HGB, HCT, MCV, PLT No results found for: ALT, AST, GGT, ALKPHOS, BILITOT   Review of Systems  Constitutional:  Negative for chills, fever and malaise/fatigue.  HENT:  Negative for drooling, ear discharge, ear pain and sore throat.   Eyes:  Negative for blurred vision.  Respiratory:  Negative for cough, shortness of breath and wheezing.   Cardiovascular:  Negative for chest pain, palpitations, orthopnea, leg swelling and PND.  Gastrointestinal:  Negative for abdominal pain, blood in stool, constipation, diarrhea and nausea.  Endocrine: Negative for polydipsia.  Genitourinary:  Negative for dysuria, frequency, hematuria and urgency.  Musculoskeletal:  Negative for back pain, myalgias and neck pain.  Skin:  Negative for rash.  Allergic/Immunologic: Negative for environmental allergies.  Neurological:  Negative for dizziness, tingling, focal weakness, numbness and headaches.  Hematological:  Does not bruise/bleed easily.  Psychiatric/Behavioral:  Negative for suicidal ideas. The patient is not nervous/anxious.    There are no problems to display for this patient.   Allergies  Allergen Reactions   Penicillin G Other (See Comments)    As a child    Past Surgical History:  Procedure Laterality Date   COLONOSCOPY  02/23/2012   normal    Social History   Tobacco Use   Smoking status: Former    Packs/day: 1.00    Years: 30.00    Pack years: 30.00  Types: Cigarettes    Quit date: 05/24/2019    Years since quitting: 1.5   Smokeless tobacco: Never  Vaping Use   Vaping Use: Never used  Substance Use Topics   Alcohol use: No    Alcohol/week: 0.0 standard drinks   Drug use: No      Medication list has been reviewed and updated.  Current Meds  Medication Sig   aspirin 81 MG tablet Take 81 mg by mouth daily.   atorvastatin (LIPITOR) 10 MG tablet Take 1 tablet by mouth once daily   lisinopril (ZESTRIL) 10 MG tablet Take 1 tablet (10 mg total) by mouth daily.   meloxicam (MOBIC) 15 MG tablet Take 1 tablet (15 mg total) by mouth daily.    PHQ 2/9 Scores 12/02/2020 06/17/2020 11/28/2019 03/14/2019  PHQ - 2 Score 0 0 0 0  PHQ- 9 Score 0 0 - 0    GAD 7 : Generalized Anxiety Score 12/02/2020 06/17/2020 03/14/2019  Nervous, Anxious, on Edge 0 0 0  Control/stop worrying 0 0 0  Worry too much - different things 0 0 0  Trouble relaxing 0 0 0  Restless 0 0 0  Easily annoyed or irritable 0 0 0  Afraid - awful might happen 0 0 0  Total GAD 7 Score 0 0 0    BP Readings from Last 3 Encounters:  06/17/20 140/90  03/14/19 120/80  09/08/18 134/76    Physical Exam Vitals and nursing note reviewed.  HENT:     Head: Normocephalic.     Right Ear: Tympanic membrane and external ear normal.     Left Ear: Tympanic membrane and external ear normal.     Nose: Nose normal. No congestion or rhinorrhea.  Eyes:     General: No scleral icterus.       Right eye: No discharge.        Left eye: No discharge.     Conjunctiva/sclera: Conjunctivae normal.     Pupils: Pupils are equal, round, and reactive to light.  Neck:     Thyroid: No thyromegaly.     Vascular: No carotid bruit or JVD.     Trachea: No tracheal deviation.  Cardiovascular:     Rate and Rhythm: Normal rate and regular rhythm.     Heart sounds: Normal heart sounds. No murmur heard.   No friction rub. No gallop.  Pulmonary:     Effort: No respiratory distress.     Breath sounds: Normal breath sounds. No wheezing, rhonchi or rales.  Abdominal:     General: Bowel sounds are normal.     Palpations: Abdomen is soft. There is no mass.     Tenderness: There is no abdominal tenderness. There is no guarding or  rebound.  Musculoskeletal:        General: No tenderness. Normal range of motion.     Cervical back: Normal range of motion and neck supple.  Lymphadenopathy:     Cervical: No cervical adenopathy.  Skin:    General: Skin is warm.     Findings: No rash.  Neurological:     General: No focal deficit present.     Mental Status: He is alert and oriented to person, place, and time.     Cranial Nerves: No cranial nerve deficit.     Deep Tendon Reflexes: Reflexes are normal and symmetric.    Wt Readings from Last 3 Encounters:  12/02/20 275 lb (124.7 kg)  06/17/20 281 lb (127.5 kg)  03/14/19 257 lb (  116.6 kg)    Ht 5\' 8"  (1.727 m)   Wt 275 lb (124.7 kg)   BMI 41.81 kg/m   Assessment and Plan:  1. Mixed hyperlipidemia Chronic.  Controlled.  Stable.  Uncomplicated.  Continue atorvastatin 10 mg once a day.  Will check lipid panel for current status of LDL. - atorvastatin (LIPITOR) 10 MG tablet; Take 1 tablet (10 mg total) by mouth daily.  Dispense: 90 tablet; Refill: 1 - Lipid Panel With LDL/HDL Ratio  2. Essential hypertension Chronic.  Controlled.  Stable.  Blood pressure 126/76.  Continue lisinopril 10 mg once a day.  Encouraged to lose weight. - lisinopril (ZESTRIL) 10 MG tablet; Take 1 tablet (10 mg total) by mouth daily.  Dispense: 90 tablet; Refill: 1 - Comprehensive Metabolic Panel (CMET)  3. Primary osteoarthritis of both knees Chronic.  Episodic.  Patient takes meloxicam 15 mg daily as needed and flareup of his knee pain.  Primarily distal left knee but patient is able to bear weight and has no decreased range of motion. - meloxicam (MOBIC) 15 MG tablet; Take 1 tablet (15 mg total) by mouth daily.  Dispense: 90 tablet; Refill: 1  4. Need for immunization against influenza Discussed and administered - Flu Vaccine QUAD High Dose(Fluad)

## 2020-12-03 LAB — COMPREHENSIVE METABOLIC PANEL
ALT: 20 IU/L (ref 0–44)
AST: 21 IU/L (ref 0–40)
Albumin/Globulin Ratio: 1.5 (ref 1.2–2.2)
Albumin: 4.4 g/dL (ref 3.8–4.8)
Alkaline Phosphatase: 75 IU/L (ref 44–121)
BUN/Creatinine Ratio: 11 (ref 10–24)
BUN: 15 mg/dL (ref 8–27)
Bilirubin Total: 0.5 mg/dL (ref 0.0–1.2)
CO2: 22 mmol/L (ref 20–29)
Calcium: 9.6 mg/dL (ref 8.6–10.2)
Chloride: 98 mmol/L (ref 96–106)
Creatinine, Ser: 1.39 mg/dL — ABNORMAL HIGH (ref 0.76–1.27)
Globulin, Total: 2.9 g/dL (ref 1.5–4.5)
Glucose: 117 mg/dL — ABNORMAL HIGH (ref 70–99)
Potassium: 5 mmol/L (ref 3.5–5.2)
Sodium: 138 mmol/L (ref 134–144)
Total Protein: 7.3 g/dL (ref 6.0–8.5)
eGFR: 56 mL/min/{1.73_m2} — ABNORMAL LOW (ref >59)

## 2020-12-03 LAB — LIPID PANEL WITH LDL/HDL RATIO
Cholesterol, Total: 176 mg/dL (ref 100–199)
HDL: 33 mg/dL — ABNORMAL LOW (ref >39)
LDL Chol Calc (NIH): 106 mg/dL — ABNORMAL HIGH (ref 0–99)
LDL/HDL Ratio: 3.2 ratio (ref 0.0–3.6)
Triglycerides: 214 mg/dL — ABNORMAL HIGH (ref 0–149)
VLDL Cholesterol Cal: 37 mg/dL (ref 5–40)

## 2020-12-25 DIAGNOSIS — H52221 Regular astigmatism, right eye: Secondary | ICD-10-CM | POA: Diagnosis not present

## 2020-12-25 DIAGNOSIS — I10 Essential (primary) hypertension: Secondary | ICD-10-CM | POA: Diagnosis not present

## 2020-12-25 DIAGNOSIS — H25811 Combined forms of age-related cataract, right eye: Secondary | ICD-10-CM | POA: Diagnosis not present

## 2021-01-01 DIAGNOSIS — H52222 Regular astigmatism, left eye: Secondary | ICD-10-CM | POA: Diagnosis not present

## 2021-01-01 DIAGNOSIS — I10 Essential (primary) hypertension: Secondary | ICD-10-CM | POA: Diagnosis not present

## 2021-01-01 DIAGNOSIS — H25812 Combined forms of age-related cataract, left eye: Secondary | ICD-10-CM | POA: Diagnosis not present

## 2021-05-12 ENCOUNTER — Encounter: Payer: Self-pay | Admitting: Family Medicine

## 2021-05-12 ENCOUNTER — Ambulatory Visit
Admission: RE | Admit: 2021-05-12 | Discharge: 2021-05-12 | Disposition: A | Discharge status: Home / Self Care | Payer: PPO | Source: Ambulatory Visit | Attending: Family Medicine | Admitting: Family Medicine

## 2021-05-12 ENCOUNTER — Telehealth: Payer: Self-pay

## 2021-05-12 ENCOUNTER — Other Ambulatory Visit: Payer: Self-pay

## 2021-05-12 ENCOUNTER — Ambulatory Visit
Admission: RE | Admit: 2021-05-12 | Discharge: 2021-05-12 | Disposition: A | Discharge status: Home / Self Care | Payer: PPO | Attending: Family Medicine | Admitting: Family Medicine

## 2021-05-12 ENCOUNTER — Ambulatory Visit (INDEPENDENT_AMBULATORY_CARE_PROVIDER_SITE_OTHER): Payer: PPO | Admitting: Family Medicine

## 2021-05-12 VITALS — BP 130/100 | HR 80 | Ht 68.0 in | Wt 281.0 lb

## 2021-05-12 DIAGNOSIS — M85872 Other specified disorders of bone density and structure, left ankle and foot: Secondary | ICD-10-CM | POA: Diagnosis not present

## 2021-05-12 DIAGNOSIS — M7662 Achilles tendinitis, left leg: Secondary | ICD-10-CM | POA: Diagnosis not present

## 2021-05-12 DIAGNOSIS — M778 Other enthesopathies, not elsewhere classified: Secondary | ICD-10-CM | POA: Diagnosis not present

## 2021-05-12 DIAGNOSIS — M1711 Unilateral primary osteoarthritis, right knee: Secondary | ICD-10-CM | POA: Diagnosis not present

## 2021-05-12 DIAGNOSIS — M170 Bilateral primary osteoarthritis of knee: Secondary | ICD-10-CM

## 2021-05-12 DIAGNOSIS — M1712 Unilateral primary osteoarthritis, left knee: Secondary | ICD-10-CM | POA: Diagnosis not present

## 2021-05-12 DIAGNOSIS — M19072 Primary osteoarthritis, left ankle and foot: Secondary | ICD-10-CM | POA: Diagnosis not present

## 2021-05-12 NOTE — Telephone Encounter (Signed)
Copied from CRM [id number]. Topic: General - Other ?>> May 12, 2021  8:02 AM Payton Spark N wrote: ?Reason for CRM: Pt called in to return a call he just received, I did not see any notes or CRMS as to who and why he was called, but pt was returning that call if someone could reach back out to him. Please advise. ?

## 2021-05-12 NOTE — Progress Notes (Signed)
? ? ?Date:  05/12/2021  ? ?Name:  Brandon Perez   DOB:  1955/10/12   MRN:  [id number] ? ? ?Chief Complaint: Foot Pain (L) foot pain- told he had a bone spur "a few years ago") and Knee Pain (Both knees, but worse in the L) knee) ? ?Foot Pain ?This is a chronic problem. The current episode started more than 1 year ago (10 years). The problem has been gradually worsening. Associated symptoms include numbness. Pertinent negatives include no abdominal pain, chest pain, chills, coughing, fever, headaches, myalgias, nausea, neck pain, rash or sore throat. The symptoms are aggravated by standing and walking. He has tried NSAIDs for the symptoms. The treatment provided no relief.  ?Knee Pain  ?Incident onset: years. There was no injury mechanism. The pain is present in the left knee and right knee (left knee worse). The quality of the pain is described as aching. The pain is at a severity of 8/10 (3/10 right). Associated symptoms include an inability to bear weight, a loss of motion and numbness. Pertinent negatives include no loss of sensation, muscle weakness or tingling. The symptoms are aggravated by movement and weight bearing. He has tried NSAIDs for the symptoms. The treatment provided no relief.  ? ?Lab Results  ?Component Value Date  ? NA 138 12/02/2020  ? K 5.0 12/02/2020  ? CO2 22 12/02/2020  ? GLUCOSE 117 (H) 12/02/2020  ? BUN 15 12/02/2020  ? CREATININE 1.39 (H) 12/02/2020  ? CALCIUM 9.6 12/02/2020  ? EGFR 56 (L) 12/02/2020  ? GFRNONAA 69 03/14/2019  ? ?Lab Results  ?Component Value Date  ? CHOL 176 12/02/2020  ? HDL 33 (L) 12/02/2020  ? LDLCALC 106 (H) 12/02/2020  ? TRIG 214 (H) 12/02/2020  ? ?No results found for: TSH ?No results found for: HGBA1C ?No results found for: WBC, HGB, HCT, MCV, PLT ?Lab Results  ?Component Value Date  ? ALT 20 12/02/2020  ? AST 21 12/02/2020  ? ALKPHOS 75 12/02/2020  ? BILITOT 0.5 12/02/2020  ? ?No results found for: 25OHVITD2, [place], VD25OH  ? ?Review of Systems  ?Constitutional:   Negative for chills and fever.  ?HENT:  Negative for drooling, ear discharge, ear pain and sore throat.   ?Respiratory:  Negative for cough, shortness of breath and wheezing.   ?Cardiovascular:  Negative for chest pain, palpitations and leg swelling.  ?Gastrointestinal:  Negative for abdominal pain, blood in stool, constipation, diarrhea and nausea.  ?Endocrine: Negative for polydipsia.  ?Genitourinary:  Negative for dysuria, frequency, hematuria and urgency.  ?Musculoskeletal:  Negative for back pain, myalgias and neck pain.  ?Skin:  Negative for rash.  ?Allergic/Immunologic: Negative for environmental allergies.  ?Neurological:  Positive for numbness. Negative for dizziness, tingling and headaches.  ?Hematological:  Does not bruise/bleed easily.  ?Psychiatric/Behavioral:  Negative for suicidal ideas. The patient is not nervous/anxious.   ? ?There are no problems to display for this patient. ? ? ?Allergies  ?Allergen Reactions  ? Penicillin G Other (See Comments)  ?  As a child  ? ? ?Past Surgical History:  ?Procedure Laterality Date  ? COLONOSCOPY  02/23/2012  ? normal  ? ? ?Social History  ? ?Tobacco Use  ? Smoking status: Former  ?  Packs/day: 1.00  ?  Years: 30.00  ?  Pack years: 30.00  ?  Types: Cigarettes  ?  Quit date: 05/24/2019  ?  Years since quitting: 1.9  ? Smokeless tobacco: Never  ?Vaping Use  ? Vaping Use: Never used  ?  Substance Use Topics  ? Alcohol use: No  ?  Alcohol/week: 0.0 standard drinks  ? Drug use: No  ? ? ? ?Medication list has been reviewed and updated. ? ?Current Meds  ?Medication Sig  ? aspirin 81 MG tablet Take 81 mg by mouth daily.  ? atorvastatin (LIPITOR) 10 MG tablet Take 1 tablet (10 mg total) by mouth daily.  ? lisinopril (ZESTRIL) 10 MG tablet Take 1 tablet (10 mg total) by mouth daily.  ? meloxicam (MOBIC) 15 MG tablet Take 1 tablet (15 mg total) by mouth daily.  ? ? ?PHQ 2/9 Scores 05/12/2021 12/02/2020 06/17/2020 11/28/2019  ?PHQ - 2 Score 0 0 0 0  ?PHQ- 9 Score 0 0 0 -  ? ? ?GAD  7 : Generalized Anxiety Score 05/12/2021 12/02/2020 06/17/2020 03/14/2019  ?Nervous, Anxious, on Edge 0 0 0 0  ?Control/stop worrying 0 0 0 0  ?Worry too much - different things 0 0 0 0  ?Trouble relaxing 0 0 0 0  ?Restless 0 0 0 0  ?Easily annoyed or irritable 0 0 0 0  ?Afraid - awful might happen 0 0 0 0  ?Total GAD 7 Score 0 0 0 0  ?Anxiety Difficulty Not difficult at all - - -  ? ? ?BP Readings from Last 3 Encounters:  ?05/12/21 (!) 130/100  ?12/02/20 120/76  ?06/17/20 140/90  ? ? ?Physical Exam ?Vitals and nursing note reviewed.  ?HENT:  ?   Head: Normocephalic.  ?   Right Ear: Tympanic membrane, ear canal and external ear normal. There is no impacted cerumen.  ?   Left Ear: Tympanic membrane, ear canal and external ear normal. There is no impacted cerumen.  ?   Nose: Nose normal. No congestion or rhinorrhea.  ?Eyes:  ?   General: No scleral icterus.    ?   Right eye: No discharge.     ?   Left eye: No discharge.  ?   Conjunctiva/sclera: Conjunctivae normal.  ?   Pupils: Pupils are equal, round, and reactive to light.  ?Neck:  ?   Thyroid: No thyromegaly.  ?   Vascular: No JVD.  ?   Trachea: No tracheal deviation.  ?Cardiovascular:  ?   Rate and Rhythm: Normal rate and regular rhythm.  ?   Heart sounds: Normal heart sounds. No murmur heard. ?  No friction rub. No gallop.  ?Pulmonary:  ?   Effort: No respiratory distress.  ?   Breath sounds: Normal breath sounds. No wheezing, rhonchi or rales.  ?Chest:  ?   Chest wall: No tenderness.  ?Abdominal:  ?   General: Bowel sounds are normal.  ?   Palpations: Abdomen is soft. There is no mass.  ?   Tenderness: There is no abdominal tenderness. There is no guarding or rebound.  ?Musculoskeletal:  ?   Cervical back: Normal range of motion and neck supple.  ?   Left knee: Decreased range of motion. Tenderness present over the medial joint line.  ?   Left ankle:  ?   Left Achilles Tendon: Tenderness present.  ?   Comments: Extension block  ?Lymphadenopathy:  ?   Cervical: No  cervical adenopathy.  ?Skin: ?   General: Skin is warm.  ?   Findings: No rash.  ?Neurological:  ?   Mental Status: He is alert and oriented to person, place, and time.  ?   Cranial Nerves: No cranial nerve deficit.  ?   Deep Tendon Reflexes: Reflexes  are normal and symmetric.  ? ? ?Wt Readings from Last 3 Encounters:  ?05/12/21 281 lb (127.5 kg)  ?12/02/20 275 lb (124.7 kg)  ?06/17/20 281 lb (127.5 kg)  ? ? ?BP (!) 130/100   Pulse 80   Ht _0  (1.727 m)   Wt 281 lb (127.5 kg)   BMI 42.73 kg/m?  ? ?Assessment and Plan: ? ?1. Primary osteoarthritis of both knees ?Chronic.  Uncontrolled.  Relatively stable but significant change in being able to walk particularly with the left knee.  No significant effusion noted.  Tenderness medial joint line with mild extension block last 10 degrees.  Right knee not as tender but some involvement as well.  We will obtain x-rays above and refer to sports medicine. ?- DG Knee Complete 4 Views Left; Future ?- DG Knee Complete 4 Views Right; Future ? ?2. Achilles tendinitis of left lower extremity ?Chronic greater than 10 years past x-ray sometime ago noted a "bone spur" of the distal insertion.  There is swelling of the Achilles bursa with mild tenderness.  This is a chronic tendinitis with likely bursitis.  We will x-ray foot to evaluate and refer to sports medicine. ?- DG Foot Complete Left; Future  ? ? ?

## 2021-05-14 ENCOUNTER — Other Ambulatory Visit: Payer: Self-pay

## 2021-05-14 ENCOUNTER — Ambulatory Visit (INDEPENDENT_AMBULATORY_CARE_PROVIDER_SITE_OTHER): Payer: PPO | Admitting: Family Medicine

## 2021-05-14 ENCOUNTER — Encounter: Payer: Self-pay | Admitting: Family Medicine

## 2021-05-14 VITALS — BP 138/88 | HR 78 | Ht 68.0 in | Wt 283.0 lb

## 2021-05-14 DIAGNOSIS — M1711 Unilateral primary osteoarthritis, right knee: Secondary | ICD-10-CM | POA: Diagnosis not present

## 2021-05-14 DIAGNOSIS — M6528 Calcific tendinitis, other site: Secondary | ICD-10-CM | POA: Diagnosis not present

## 2021-05-14 DIAGNOSIS — M1712 Unilateral primary osteoarthritis, left knee: Secondary | ICD-10-CM | POA: Diagnosis not present

## 2021-05-14 DIAGNOSIS — M7662 Achilles tendinitis, left leg: Secondary | ICD-10-CM | POA: Insufficient documentation

## 2021-05-14 MED ORDER — DICLOFENAC SODIUM 75 MG PO TBEC: 75.0000 mg | DELAYED_RELEASE_TABLET | Freq: Two times a day (BID) | ORAL | 0 refills | Status: DC

## 2021-05-14 NOTE — Patient Instructions (Signed)
-   Transition to diclofenac, every a.m. and every p.m. scheduled until follow-up (take with food) ?- Perform activity as tolerated using knee and ankle symptoms as a guide ?- Start home exercises for the left ankle with information provided, focus on slow and steady advance ?- Use silicone heel cup inserts for both feet ?- Return for follow-up in 2 weeks, contact her office for any questions between now and then ?

## 2021-05-14 NOTE — Progress Notes (Signed)
?  ? ?Primary Care / Sports Medicine Office Visit ? ?Patient Information:  ?Patient ID: Brandon Perez, male DOB: June 15, 1955 Age: 66 y.o. MRN: [id number]  ? ?Brandon Perez is a pleasant 66 y.o. male presenting with the following: ? ?Chief Complaint  ?Patient presents with  ? Knee Pain  ?  Left knee for 8 years. Right knee 4 years. Left knee bothers him more. Taking Meloxicam. Did have Xrays done.   ? Ankle Pain  ?  Left back for 10 years. Has bone spurs. Has got worst, does take Meloxicam. Does have Xrays  ? ? ?Vitals:  ? 05/14/21 0828  ?BP: 138/88  ?Pulse: 78  ?SpO2: 98%  ? ?Vitals:  ? 05/14/21 0828  ?Weight: 283 lb (128.4 kg)  ?Height: 5\' 8"  (1.727 m)  ? ?Body mass index is 43.03 kg/m?. ? ?No results found.  ? ?Independent interpretation of notes and tests performed by another provider:  ? ?Independent interpretation of recent bilateral knee x-rays from [facility] reveal tricompartmental osteoarthritis with right greater than left severe medial tibiofemoral joint space narrowing and degenerative changes, secondary symmetric severe patellofemoral involvement, no acute osseous injury noted ? ?Independent interpretation of left foot x-rays demonstrate scattered degenerative changes about the midfoot and forefoot, prominent Achilles enthesophyte and calcaneal spur, no acute osseous process noted ? ?Procedures performed:  ? ?None ? ?Pertinent History, Exam, Impression, and Recommendations:  ? ?Primary osteoarthritis of left knee ?Patient presented with roughly 8 years of atraumatic left knee pain, medial in location without radiation, has been progressively worsening, associated with pain during prolonged weightbearing on hard surfaces, stiffness, of note has noted some minor near buckling. ? ?Examination shows focal tenderness about the medial tibiofemoral articulation and pain during maximal attempted flexion localizing anteriorly, no laxity but there is pain when providing valgus stressing, otherwise provocative testing  benign. ? ?Given his x-ray and clinical findings, primary concern for underlying osteoarthritis related arthralgia, severe nature, treatments discussed and plan for transition to scheduled diclofenac for 2 weeks, low threshold for intra-articular corticosteroid injection, and we will reach out to obtain authorization for viscosupplementation. ? ?Chronic condition, symptomatic, x-ray interpretation, Rx management ? ?Primary osteoarthritis of right knee ?Right chronic knee pain, atraumatic onset going back roughly 4 years, this is in the setting of more significant left knee pain.  Medial location, aggravated with weightbearing, feels that he is "favoring "this knee resulting in his noted pain. ? ?Examination shows mild medial joint line tenderness, no laxity with AP and varus/valgus stressing, there is pain with passive maximal flexion localizing anteriorly, provocative testing otherwise benign. ? ?His x-rays do demonstrate severe medial tibiofemoral osteoarthritis and secondary severe involvement of the patellofemoral articulation.  That being said his clinical history and findings do support this being a secondary/compensatory source of pain with primary symptoms stemming from his left knee. ? ?I have advised scheduled diclofenac, relative rest, close follow-up with a low threshold for intra-articular corticosteroid injection if symptoms persist without adequate improvement.  We will send authorization for viscosupplementation for this knee for long-term pain control. ? ?Chronic condition, symptomatic, independent interpretation x-rays, Rx management ? ?Calcific Achilles tendinitis of left lower extremity ?Patient with atraumatic left posterior heel pain ongoing for roughly 10 years, has been told in the past that he has "bone spurs ", has been dosing meloxicam with modest benefit.  Mild pain with first up in the morning, most pain with prolonged weightbearing, motion after period of immobility.  Denies any  paresthesias. ? ?Examination reveals focal tenderness  about the prominence noted at the posterior calcaneus/Achilles insertion, Thompson test negative, mild tenderness at the anterior calcaneus/plantar fascia origin, heel raise elicits pain. ? ?Given his x-rays and physical examination findings, his clinical picture is most consistent with calcific Achilles tendinopathy of the left ankle, plan for transition to scheduled diclofenac, heel cup inserts, home exercises, and close follow-up.  If suboptimal progress noted, can advance to formal physical therapy, cam boot +/- corticosteroid injection.  If we did proceed with injection he would need cam boot to minimize risk for tendon rupture. ? ?Chronic condition, symptomatic, independent interpretation x-rays, Rx management  ? ?Orders & Medications ?Meds ordered this encounter  ?Medications  ? diclofenac (VOLTAREN) 75 MG EC tablet  ?  Sig: Take 1 tablet (75 mg total) by mouth 2 (two) times daily.  ?  Dispense:  30 tablet  ?  Refill:  0  ? ?No orders of the defined types were placed in this encounter. ?  ? ?Return in about 2 weeks (around 05/28/2021).  ?  ? ?Jerrol Banana, MD ? ? Primary Care Sports Medicine ?[facility] ?[facility]  ? ?

## 2021-05-14 NOTE — Assessment & Plan Note (Signed)
Patient with atraumatic left posterior heel pain ongoing for roughly 10 years, has been told in the past that he has "bone spurs ", has been dosing meloxicam with modest benefit.  Mild pain with first up in the morning, most pain with prolonged weightbearing, motion after period of immobility.  Denies any paresthesias. ? ?Examination reveals focal tenderness about the prominence noted at the posterior calcaneus/Achilles insertion, Thompson test negative, mild tenderness at the anterior calcaneus/plantar fascia origin, heel raise elicits pain. ? ?Given his x-rays and physical examination findings, his clinical picture is most consistent with calcific Achilles tendinopathy of the left ankle, plan for transition to scheduled diclofenac, heel cup inserts, home exercises, and close follow-up.  If suboptimal progress noted, can advance to formal physical therapy, cam boot +/- corticosteroid injection.  If we did proceed with injection he would need cam boot to minimize risk for tendon rupture. ? ?Chronic condition, symptomatic, independent interpretation x-rays, Rx management ?

## 2021-05-14 NOTE — Assessment & Plan Note (Signed)
Patient presented with roughly 8 years of atraumatic left knee pain, medial in location without radiation, has been progressively worsening, associated with pain during prolonged weightbearing on hard surfaces, stiffness, of note has noted some minor near buckling. ? ?Examination shows focal tenderness about the medial tibiofemoral articulation and pain during maximal attempted flexion localizing anteriorly, no laxity but there is pain when providing valgus stressing, otherwise provocative testing benign. ? ?Given his x-ray and clinical findings, primary concern for underlying osteoarthritis related arthralgia, severe nature, treatments discussed and plan for transition to scheduled diclofenac for 2 weeks, low threshold for intra-articular corticosteroid injection, and we will reach out to obtain authorization for viscosupplementation. ? ?Chronic condition, symptomatic, x-ray interpretation, Rx management ?

## 2021-05-14 NOTE — Assessment & Plan Note (Signed)
Right chronic knee pain, atraumatic onset going back roughly 4 years, this is in the setting of more significant left knee pain.  Medial location, aggravated with weightbearing, feels that he is "favoring "this knee resulting in his noted pain. ? ?Examination shows mild medial joint line tenderness, no laxity with AP and varus/valgus stressing, there is pain with passive maximal flexion localizing anteriorly, provocative testing otherwise benign. ? ?His x-rays do demonstrate severe medial tibiofemoral osteoarthritis and secondary severe involvement of the patellofemoral articulation.  That being said his clinical history and findings do support this being a secondary/compensatory source of pain with primary symptoms stemming from his left knee. ? ?I have advised scheduled diclofenac, relative rest, close follow-up with a low threshold for intra-articular corticosteroid injection if symptoms persist without adequate improvement.  We will send authorization for viscosupplementation for this knee for long-term pain control. ? ?Chronic condition, symptomatic, independent interpretation x-rays, Rx management ?

## 2021-05-18 ENCOUNTER — Telehealth: Payer: Self-pay

## 2021-05-18 NOTE — Telephone Encounter (Signed)
Completed on [employer]. ? ?Case #- [id number] ? ?KP ?

## 2021-05-18 NOTE — Progress Notes (Signed)
Reviewed with pt at visit with Dr. Zigmund Daniel. ? ?KP ?

## 2021-05-18 NOTE — Progress Notes (Signed)
Reviewed with Dr. Zigmund Daniel at office visit. ? ?KP ?

## 2021-05-18 NOTE — Telephone Encounter (Signed)
PA completed for [employer] for Bilateral knee injections. ? ?Case# [id number]  ? ?Previous was for left knee only. ? ?KP ?

## 2021-06-03 ENCOUNTER — Ambulatory Visit: Payer: Medicare PPO | Admitting: Family Medicine

## 2021-06-12 ENCOUNTER — Ambulatory Visit: Payer: PPO | Admitting: Family Medicine

## 2021-06-12 ENCOUNTER — Other Ambulatory Visit: Payer: Self-pay

## 2021-06-12 ENCOUNTER — Encounter: Payer: Self-pay | Admitting: Family Medicine

## 2021-06-12 VITALS — BP 120/70 | HR 68 | Ht 68.0 in | Wt 280.0 lb

## 2021-06-12 DIAGNOSIS — E782 Mixed hyperlipidemia: Secondary | ICD-10-CM

## 2021-06-12 DIAGNOSIS — M1712 Unilateral primary osteoarthritis, left knee: Secondary | ICD-10-CM

## 2021-06-12 DIAGNOSIS — I10 Essential (primary) hypertension: Secondary | ICD-10-CM

## 2021-06-12 DIAGNOSIS — M6528 Calcific tendinitis, other site: Secondary | ICD-10-CM

## 2021-06-12 DIAGNOSIS — M1711 Unilateral primary osteoarthritis, right knee: Secondary | ICD-10-CM

## 2021-06-12 DIAGNOSIS — R739 Hyperglycemia, unspecified: Secondary | ICD-10-CM | POA: Diagnosis not present

## 2021-06-12 MED ORDER — DICLOFENAC SODIUM 75 MG PO TBEC: 75.0000 mg | DELAYED_RELEASE_TABLET | Freq: Two times a day (BID) | ORAL | 0 refills | Status: DC

## 2021-06-12 MED ORDER — ATORVASTATIN CALCIUM 10 MG PO TABS: 10.0000 mg | ORAL_TABLET | Freq: Every day | ORAL | 1 refills | Status: DC

## 2021-06-12 MED ORDER — LISINOPRIL 10 MG PO TABS: 10.0000 mg | ORAL_TABLET | Freq: Every day | ORAL | 1 refills | Status: DC

## 2021-06-12 NOTE — Progress Notes (Signed)
? ? ?Date:  06/12/2021  ? ?Name:  Brandon Perez   DOB:  1955/04/12   MRN:  [id number] ? ? ?Chief Complaint: Hypertension, Hyperlipidemia, and Hyperglycemia (A1C check) ? ?Hypertension ?This is a chronic problem. The current episode started more than 1 year ago. The problem has been gradually improving since onset. The problem is controlled. Pertinent negatives include no chest pain, headaches, neck pain, palpitations or shortness of breath. There are no associated agents to hypertension. Risk factors for coronary artery disease include dyslipidemia. Past treatments include ACE inhibitors. The current treatment provides moderate improvement. There is no history of angina, kidney disease, CAD/MI, CVA, heart failure, left ventricular hypertrophy, PVD or retinopathy. There is no history of chronic renal disease, a hypertension causing med or renovascular disease.  ?Hyperlipidemia ?This is a chronic problem. The current episode started more than 1 year ago. The problem is controlled. Recent lipid tests were reviewed and are normal. He has no history of chronic renal disease, diabetes, hypothyroidism, liver disease, obesity or nephrotic syndrome. Pertinent negatives include no chest pain, myalgias or shortness of breath. Current antihyperlipidemic treatment includes statins. The current treatment provides moderate improvement of lipids. There are no compliance problems.  Risk factors for coronary artery disease include dyslipidemia and hypertension.  ?Hyperglycemia ?This is a chronic problem. The current episode started more than 1 year ago. The problem occurs intermittently. The problem has been gradually improving. Pertinent negatives include no abdominal pain, anorexia, change in bowel habit, chest pain, chills, congestion, coughing, fatigue, fever, headaches, myalgias, nausea, neck pain, rash, sore throat or vertigo.  ? ?Lab Results  ?Component Value Date  ? NA 138 12/02/2020  ? K 5.0 12/02/2020  ? CO2 22 12/02/2020  ?  GLUCOSE 117 (H) 12/02/2020  ? BUN 15 12/02/2020  ? CREATININE 1.39 (H) 12/02/2020  ? CALCIUM 9.6 12/02/2020  ? EGFR 56 (L) 12/02/2020  ? GFRNONAA 69 03/14/2019  ? ?Lab Results  ?Component Value Date  ? CHOL 176 12/02/2020  ? HDL 33 (L) 12/02/2020  ? LDLCALC 106 (H) 12/02/2020  ? TRIG 214 (H) 12/02/2020  ? ?No results found for: TSH ?No results found for: HGBA1C ?No results found for: WBC, HGB, HCT, MCV, PLT ?Lab Results  ?Component Value Date  ? ALT 20 12/02/2020  ? AST 21 12/02/2020  ? ALKPHOS 75 12/02/2020  ? BILITOT 0.5 12/02/2020  ? ?No results found for: 25OHVITD2, [place], VD25OH  ? ?Review of Systems  ?Constitutional:  Negative for chills, fatigue and fever.  ?HENT:  Negative for congestion, drooling, ear discharge, ear pain and sore throat.   ?Respiratory:  Negative for cough, shortness of breath and wheezing.   ?Cardiovascular:  Negative for chest pain, palpitations and leg swelling.  ?Gastrointestinal:  Negative for abdominal pain, anorexia, blood in stool, change in bowel habit, constipation, diarrhea and nausea.  ?Endocrine: Negative for polydipsia.  ?Genitourinary:  Negative for dysuria, frequency, hematuria and urgency.  ?Musculoskeletal:  Negative for back pain, myalgias and neck pain.  ?Skin:  Negative for rash.  ?Allergic/Immunologic: Negative for environmental allergies.  ?Neurological:  Negative for dizziness, vertigo and headaches.  ?Hematological:  Does not bruise/bleed easily.  ?Psychiatric/Behavioral:  Negative for suicidal ideas. The patient is not nervous/anxious.   ? ?Patient Active Problem List  ? Diagnosis Date Noted  ? Primary osteoarthritis of left knee 05/14/2021  ? Primary osteoarthritis of right knee 05/14/2021  ? Calcific Achilles tendinitis of left lower extremity 05/14/2021  ? ? ?Allergies  ?Allergen Reactions  ? Penicillin G  Other (See Comments)  ?  As a child  ? ? ?Past Surgical History:  ?Procedure Laterality Date  ? COLONOSCOPY  02/23/2012  ? normal  ? ? ?Social History   ? ?Tobacco Use  ? Smoking status: Former  ?  Packs/day: 1.00  ?  Years: 30.00  ?  Pack years: 30.00  ?  Types: Cigarettes  ?  Quit date: 05/24/2019  ?  Years since quitting: 2.0  ? Smokeless tobacco: Never  ?Vaping Use  ? Vaping Use: Never used  ?Substance Use Topics  ? Alcohol use: No  ?  Alcohol/week: 0.0 standard drinks  ? Drug use: No  ? ? ? ?Medication list has been reviewed and updated. ? ?Current Meds  ?Medication Sig  ? aspirin 81 MG tablet Take 81 mg by mouth daily.  ? atorvastatin (LIPITOR) 10 MG tablet Take 1 tablet (10 mg total) by mouth daily.  ? lisinopril (ZESTRIL) 10 MG tablet Take 1 tablet (10 mg total) by mouth daily.  ? metoprolol succinate (TOPROL-XL) 50 MG 24 hr tablet Take by mouth.  ? ? ? ?  06/12/2021  ?  9:40 AM 05/14/2021  ?  8:32 AM 05/12/2021  ?  1:22 PM 12/02/2020  ?  9:46 AM  ?GAD 7 : Generalized Anxiety Score  ?Nervous, Anxious, on Edge 0 0 0 0  ?Control/stop worrying 0 0 0 0  ?Worry too much - different things 0 0 0 0  ?Trouble relaxing 0 0 0 0  ?Restless 0 0 0 0  ?Easily annoyed or irritable 0 0 0 0  ?Afraid - awful might happen 0 0 0 0  ?Total GAD 7 Score 0 0 0 0  ?Anxiety Difficulty Not difficult at all  Not difficult at all   ? ? ? ?  06/12/2021  ?  9:40 AM  ?Depression screen PHQ 2/9  ?Decreased Interest 0  ?Down, Depressed, Hopeless 0  ?PHQ - 2 Score 0  ?Altered sleeping 0  ?Tired, decreased energy 0  ?Change in appetite 0  ?Feeling bad or failure about yourself  0  ?Trouble concentrating 0  ?Moving slowly or fidgety/restless 0  ?Suicidal thoughts 0  ?PHQ-9 Score 0  ?Difficult doing work/chores Not difficult at all  ? ? ?BP Readings from Last 3 Encounters:  ?06/12/21 120/70  ?05/14/21 138/88  ?05/12/21 (!) 130/100  ? ? ?Physical Exam ?Vitals and nursing note reviewed.  ?HENT:  ?   Head: Normocephalic.  ?   Right Ear: Tympanic membrane, ear canal and external ear normal.  ?   Left Ear: Tympanic membrane, ear canal and external ear normal.  ?   Nose: Nose normal. No congestion or  rhinorrhea.  ?Eyes:  ?   General: No scleral icterus.    ?   Right eye: No discharge.     ?   Left eye: No discharge.  ?   Conjunctiva/sclera: Conjunctivae normal.  ?   Pupils: Pupils are equal, round, and reactive to light.  ?Neck:  ?   Thyroid: No thyromegaly.  ?   Vascular: No JVD.  ?   Trachea: No tracheal deviation.  ?Cardiovascular:  ?   Rate and Rhythm: Normal rate and regular rhythm.  ?   Heart sounds: Normal heart sounds. No murmur heard. ?  No friction rub. No gallop.  ?Pulmonary:  ?   Effort: No respiratory distress.  ?   Breath sounds: Normal breath sounds. No wheezing, rhonchi or rales.  ?Abdominal:  ?   General:  Bowel sounds are normal.  ?   Palpations: Abdomen is soft. There is no mass.  ?   Tenderness: There is no abdominal tenderness. There is no guarding or rebound.  ?Musculoskeletal:     ?   General: No tenderness. Normal range of motion.  ?   Cervical back: Normal range of motion and neck supple.  ?Lymphadenopathy:  ?   Cervical: No cervical adenopathy.  ?Skin: ?   General: Skin is warm.  ?   Findings: No rash.  ?Neurological:  ?   Mental Status: He is alert and oriented to person, place, and time.  ?   Cranial Nerves: No cranial nerve deficit.  ?   Deep Tendon Reflexes: Reflexes are normal and symmetric.  ? ? ?Wt Readings from Last 3 Encounters:  ?06/12/21 280 lb (127 kg)  ?05/14/21 283 lb (128.4 kg)  ?05/12/21 281 lb (127.5 kg)  ? ? ?BP 120/70   Pulse 68   Ht _0  (1.727 m)   Wt 280 lb (127 kg)   BMI 42.57 kg/m?  ? ?Assessment and Plan: ? ?1. Essential hypertension ?Chronic.  Controlled.  Stable.  Blood pressure 120/70.  Continue lisinopril 10 mg once a day.  Will check renal function panel. ?- lisinopril (ZESTRIL) 10 MG tablet; Take 1 tablet (10 mg total) by mouth daily.  Dispense: 90 tablet; Refill: 1 ?- Renal Function Panel ? ?2. Mixed hyperlipidemia ?Chronic.  Controlled.  Stable.  Continue atorvastatin 10 mg once a day.  Check renal function panel ?- atorvastatin (LIPITOR) 10 MG  tablet; Take 1 tablet (10 mg total) by mouth daily.  Dispense: 90 tablet; Refill: 1 ?- Renal Function Panel ? ?3. Hyperglycemia ?Chronic.  Controlled.  Stable.  Mild elevation of glucose the last 2 readings.  We will che

## 2021-06-13 LAB — RENAL FUNCTION PANEL
Albumin: 4.2 g/dL (ref 3.8–4.8)
BUN/Creatinine Ratio: 14 (ref 10–24)
BUN: 17 mg/dL (ref 8–27)
CO2: 21 mmol/L (ref 20–29)
Calcium: 9.2 mg/dL (ref 8.6–10.2)
Chloride: 103 mmol/L (ref 96–106)
Creatinine, Ser: 1.22 mg/dL (ref 0.76–1.27)
Glucose: 103 mg/dL — ABNORMAL HIGH (ref 70–99)
Phosphorus: 2.9 mg/dL (ref 2.8–4.1)
Potassium: 4.6 mmol/L (ref 3.5–5.2)
Sodium: 139 mmol/L (ref 134–144)
eGFR: 65 mL/min/{1.73_m2} (ref >59)

## 2021-06-13 LAB — LIPID PANEL WITH LDL/HDL RATIO
Cholesterol, Total: 157 mg/dL (ref 100–199)
HDL: 36 mg/dL — ABNORMAL LOW (ref >39)
LDL Chol Calc (NIH): 87 mg/dL (ref 0–99)
LDL/HDL Ratio: 2.4 ratio (ref 0.0–3.6)
Triglycerides: 202 mg/dL — ABNORMAL HIGH (ref 0–149)
VLDL Cholesterol Cal: 34 mg/dL (ref 5–40)

## 2021-06-13 LAB — HEMOGLOBIN A1C
Est. average glucose Bld gHb Est-mCnc: 146 mg/dL
Hgb A1c MFr Bld: 6.7 % — ABNORMAL HIGH (ref 4.8–5.6)

## 2021-06-15 ENCOUNTER — Telehealth: Payer: Self-pay | Admitting: Family Medicine

## 2021-06-15 ENCOUNTER — Other Ambulatory Visit: Payer: Self-pay

## 2021-06-15 DIAGNOSIS — E119 Type 2 diabetes mellitus without complications: Secondary | ICD-10-CM

## 2021-06-15 MED ORDER — METFORMIN HCL 500 MG PO TABS: 500.0000 mg | ORAL_TABLET | Freq: Every day | ORAL | 2 refills | Status: DC

## 2021-06-15 NOTE — Telephone Encounter (Signed)
Pt calling back for follow up requesting a call back from [place]. ? ?

## 2021-06-15 NOTE — Progress Notes (Signed)
Sent in metformin 

## 2021-06-15 NOTE — Telephone Encounter (Signed)
Copied from CRM [id number]. Topic: Higher education careers adviser Patient (Clinic Use ONLY) ?>> Jun 15, 2021 12:04 PM Everitt Amber wrote: ?Reason for CRM: called pt to discuss labs/ abnormal A1C- recheck in 8 weeks and start metformin that I sent in ?>> Jun 15, 2021 12:46 PM Jaquita Rector A wrote: ?Patient called back for Delice Bison can be reached at Ph# [phone] ( ?

## 2021-07-14 ENCOUNTER — Other Ambulatory Visit: Payer: Self-pay | Admitting: Family Medicine

## 2021-07-14 DIAGNOSIS — M1711 Unilateral primary osteoarthritis, right knee: Secondary | ICD-10-CM

## 2021-07-14 DIAGNOSIS — M1712 Unilateral primary osteoarthritis, left knee: Secondary | ICD-10-CM

## 2021-07-14 DIAGNOSIS — M6528 Calcific tendinitis, other site: Secondary | ICD-10-CM

## 2021-07-15 NOTE — Telephone Encounter (Signed)
Requested medication (s) are due for refill today: yes  Requested medication (s) are on the active medication list: yes    Last refill: 06/12/21  #30  0 refills  Future visit scheduled yes  12/14/21  Notes to clinic:Failed due to labs, please review. Thank you.  Requested Prescriptions  Pending Prescriptions Disp Refills   diclofenac (VOLTAREN) 75 MG EC tablet [Pharmacy Med Name: Diclofenac Sodium 75 MG Oral Tablet Delayed Release] 30 tablet 0    Sig: Take 1 tablet by mouth twice daily     Analgesics:  NSAIDS Failed - 07/14/2021  8:22 PM      Failed - Manual Review: Labs are only required if the patient has taken medication for more than 8 weeks.      Failed - HGB in normal range and within 360 days    No results found for: HGB, HGBKUC, HGBPOCKUC, HGBOTHER, TOTHGB, HGBPLASMA       Failed - PLT in normal range and within 360 days    No results found for: PLT, PLTCOUNTKUC, LABPLAT, POCPLA       Failed - HCT in normal range and within 360 days    No results found for: HCT, HCTKUC, SRHCT       Passed - Cr in normal range and within 360 days    Creatinine, Ser  Date Value Ref Range Status  06/12/2021 1.22 0.76 - 1.27 mg/dL Final         Passed - eGFR is 30 or above and within 360 days    GFR calc Af Amer  Date Value Ref Range Status  03/14/2019 80 >59 mL/min/1.73 Final   GFR calc non Af Amer  Date Value Ref Range Status  03/14/2019 69 >59 mL/min/1.73 Final   eGFR  Date Value Ref Range Status  06/12/2021 65 >59 mL/min/1.73 Final         Passed - Patient is not pregnant      Passed - Valid encounter within last 12 months    Recent Outpatient Visits           1 month ago Essential hypertension   [place], Deanna C, MD   2 months ago Primary osteoarthritis of left knee   [facility] Montel Culver, MD   2 months ago Primary osteoarthritis of both knees   [facility] Juline Patch, MD   7 months ago Essential hypertension    [place], Deanna C, MD   1 year ago Essential hypertension   [place], Deanna C, MD       Future Appointments             In 5 months Juline Patch, MD [facility]

## 2021-08-17 ENCOUNTER — Other Ambulatory Visit: Payer: Self-pay

## 2021-08-17 DIAGNOSIS — E119 Type 2 diabetes mellitus without complications: Secondary | ICD-10-CM

## 2021-08-18 LAB — HEMOGLOBIN A1C
Est. average glucose Bld gHb Est-mCnc: 134 mg/dL
Hgb A1c MFr Bld: 6.3 % — ABNORMAL HIGH (ref 4.8–5.6)

## 2021-08-19 ENCOUNTER — Ambulatory Visit (INDEPENDENT_AMBULATORY_CARE_PROVIDER_SITE_OTHER): Payer: PPO | Admitting: Family Medicine

## 2021-08-19 ENCOUNTER — Encounter: Payer: Self-pay | Admitting: Family Medicine

## 2021-08-19 ENCOUNTER — Inpatient Hospital Stay (INDEPENDENT_AMBULATORY_CARE_PROVIDER_SITE_OTHER): Payer: PPO | Admitting: Radiology

## 2021-08-19 VITALS — BP 120/80 | HR 88 | Ht 68.0 in | Wt 272.0 lb

## 2021-08-19 DIAGNOSIS — M1712 Unilateral primary osteoarthritis, left knee: Secondary | ICD-10-CM

## 2021-08-19 MED ORDER — DICLOFENAC SODIUM 75 MG PO TBEC: 75.0000 mg | DELAYED_RELEASE_TABLET | Freq: Two times a day (BID) | ORAL | 0 refills | Status: AC

## 2021-08-19 NOTE — Progress Notes (Signed)
     Primary Care / Sports Medicine Office Visit  Patient Information:  Patient ID: Brandon Perez, male DOB: 04-29-1955 Age: 66 y.o. MRN: [id number]   Brandon Perez is a pleasant 66 y.o. male presenting with the following:  Chief Complaint  Patient presents with   Primary osteoarthritis of left knee    States it feels good every so often, states when he is walking it feels like it is giving out with a sharp pain.     Vitals:   08/19/21 0829  BP: 120/80  Pulse: 88  SpO2: 98%   Vitals:   08/19/21 0829  Weight: 272 lb (123.4 kg)  Height: 5\' 8"  (1.727 m)   Body mass index is 41.36 kg/m.  No results found.   Independent interpretation of notes and tests performed by another provider:   None  Procedures performed:   Procedure:  Injection of left knee under ultrasound guidance. Ultrasound guidance utilized for anterolateral approach, no effusion noted.  Samsung HS60 device utilized with permanent recording / reporting. Verbal informed consent obtained and verified. Skin prepped in a sterile fashion. Ethyl chloride for topical local analgesia.  Completed without difficulty and tolerated well. Medication: triamcinolone acetonide 40 mg/mL suspension for injection 1 mL total and 2 mL lidocaine 1% without epinephrine utilized for needle placement anesthetic Advised to contact for fevers/chills, erythema, induration, drainage, or persistent bleeding.   Pertinent History, Exam, Impression, and Recommendations:   Problem List Items Addressed This Visit       Musculoskeletal and Integument   Primary osteoarthritis of left knee - Primary    Patient presents for follow-up to chronic left knee pain in the setting of underlying tricompartmental osteoarthritis.  At his last visit we did attempt to obtain authorization for viscosupplementation, the process will be resubmitted.  He did note modest response from diclofenac but continues to note primarily medial left knee pain with  intermittent instability, no mechanical catching, locking, or buckling.  Examination shows maximal tenderness along the medial joint line, secondarily the lateral joint line, does have discomfort with maximal flexion localizing anteromedially to a range of 125 degrees.  No laxity with anterior/posterior and valgus/varus stressing.  McMurray's localized medially.  His findings are most consistent with osteoarthritis related arthralgia, we discussed treatments to date, pending treatment options, and given his described instability we did proceed with intra-articular ultrasound-guided corticosteroid injection.  Additionally I have advised him to dose diclofenac on an as-needed basis as an adjunct, lastly utilize a hinged knee brace which was provided to the patient today during periods of prolonged weightbearing/physical activity.  We will seek out viscosupplementation and utilize this option if he has limited response from cortisone.      Relevant Medications   diclofenac (VOLTAREN) 75 MG EC tablet   Other Relevant Orders   LIMITED JOINT SPACE STRUCTURES LOW LEFT     Orders & Medications Meds ordered this encounter  Medications   diclofenac (VOLTAREN) 75 MG EC tablet    Sig: Take 1 tablet (75 mg total) by mouth 2 (two) times daily.    Dispense:  180 tablet    Refill:  0   Orders Placed This Encounter  Procedures   [place] LIMITED JOINT SPACE STRUCTURES LOW LEFT     Return if symptoms worsen or fail to improve.     [place], MD   Primary Care Sports Medicine [facility]

## 2021-08-19 NOTE — Patient Instructions (Signed)
You have just been given a cortisone injection to reduce pain and inflammation. After the injection you may notice immediate relief of pain as a result of the Lidocaine. It is important to rest the area of the injection for 24 to 48 hours after the injection. There is a possibility of some temporary increased discomfort and swelling for up to 72 hours until the cortisone begins to work. If you do have pain, simply rest the joint and use ice. If you can tolerate over the counter medications, you can try Tylenol, Aleve, or Advil for added relief per package instructions. - Contact us if knee is still symptomatic when you return to town - Can dose diclofenac up to twice a day on as-needed basis for any knee pain

## 2021-08-19 NOTE — Assessment & Plan Note (Addendum)
Patient presents for follow-up to chronic left knee pain in the setting of underlying tricompartmental osteoarthritis.  At his last visit we did attempt to obtain authorization for viscosupplementation, the process will be resubmitted.  He did note modest response from diclofenac but continues to note primarily medial left knee pain with intermittent instability, no mechanical catching, locking, or buckling.  Examination shows maximal tenderness along the medial joint line, secondarily the lateral joint line, does have discomfort with maximal flexion localizing anteromedially to a range of 125 degrees.  No laxity with anterior/posterior and valgus/varus stressing.  McMurray's localized medially.  His findings are most consistent with osteoarthritis related arthralgia, we discussed treatments to date, pending treatment options, and given his described instability we did proceed with intra-articular ultrasound-guided corticosteroid injection.  Additionally I have advised him to dose diclofenac on an as-needed basis as an adjunct, lastly utilize a hinged knee brace which was provided to the patient today during periods of prolonged weightbearing/physical activity.  We will seek out viscosupplementation and utilize this option if he has limited response from cortisone.

## 2021-09-06 ENCOUNTER — Other Ambulatory Visit: Payer: Self-pay | Admitting: Family Medicine

## 2021-09-06 DIAGNOSIS — E119 Type 2 diabetes mellitus without complications: Secondary | ICD-10-CM

## 2021-11-09 ENCOUNTER — Other Ambulatory Visit: Payer: Self-pay | Admitting: Family Medicine

## 2021-11-09 ENCOUNTER — Inpatient Hospital Stay: Payer: Self-pay | Admitting: Radiology

## 2021-11-09 ENCOUNTER — Ambulatory Visit (INDEPENDENT_AMBULATORY_CARE_PROVIDER_SITE_OTHER): Payer: PPO | Admitting: Family Medicine

## 2021-11-09 ENCOUNTER — Other Ambulatory Visit: Payer: Self-pay

## 2021-11-09 ENCOUNTER — Encounter: Payer: Self-pay | Admitting: Family Medicine

## 2021-11-09 VITALS — BP 132/84 | HR 78 | Ht 68.0 in | Wt 268.0 lb

## 2021-11-09 DIAGNOSIS — M1711 Unilateral primary osteoarthritis, right knee: Secondary | ICD-10-CM | POA: Diagnosis not present

## 2021-11-09 DIAGNOSIS — S0590XD Unspecified injury of unspecified eye and orbit, subsequent encounter: Secondary | ICD-10-CM

## 2021-11-09 DIAGNOSIS — M1712 Unilateral primary osteoarthritis, left knee: Secondary | ICD-10-CM

## 2021-11-09 DIAGNOSIS — S86012A Strain of left Achilles tendon, initial encounter: Secondary | ICD-10-CM

## 2021-11-09 MED ORDER — TRIAMCINOLONE ACETONIDE 40 MG/ML IJ SUSP
40.0000 mg | Freq: Once | INTRAMUSCULAR | Status: AC
  Administered 2021-11-09: 40 mg via INTRAMUSCULAR

## 2021-11-09 NOTE — Assessment & Plan Note (Signed)
Chronic conditioning with progression/worsening.  Patient has a known longstanding history of calcific Achilles tendinopathy of the left lower extremity, describes episode roughly 2 weeks prior when stepping down off of a ladder, left forefoot was hung up on a ladder rung, describes forced maximal dorsiflexion associated with pain, pop sensation, swelling about the ankle, and subsequent dysfunction.  Felt a "charley horse "at his left ankle posteriorly, is unable to plantarflex with ambulation, denies any ecchymosis.  Examination reveals mild swelling diffusely about the ankle when compared to contralateral, there is step-off deformation superior to the calcaneus and a palpable defect is noted, negative Homans, he has a positive Thompson's test, he is able to actively plantarflex while resting his knee on exam table.  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  Activity modification discussed over the interim, cam boot offered, denies any stumbling/gait abnormalities beyond what was mentioned.

## 2021-11-09 NOTE — Assessment & Plan Note (Signed)
Chronic issue, actively symptomatic, feels that symptoms have progressively worsened over the past few weeks given comorbid left ankle issues.  Examination reveals tenderness about the patellofemoral and medial tibiofemoral compartments, treatment strategies were discussed, given that he is dosing of diclofenac scheduled and still symptomatic, he did elect to proceed with corticosteroid injection to the right knee under ultrasound guidance.  Post care reviewed, from a medication management standpoint have advised to wean and discontinuation of diclofenac as he is able to do so.  Home-based rehab advised.

## 2021-11-09 NOTE — Patient Instructions (Addendum)
You have just been given a cortisone injection to reduce pain and inflammation. After the injection you may notice immediate relief of pain as a result of the Lidocaine. It is important to rest the area of the injection for 24 to 48 hours after the injection. There is a possibility of some temporary increased discomfort and swelling for up to 72 hours until the cortisone begins to work. If you do have pain, simply rest the joint and use ice. If you can tolerate over the counter medications, you can try Tylenol, Aleve, or Advil for added relief per package instructions. - As above, transition to as needed dosing of diclofenac with a goal to wean from this medication - Referral coordinator will contact you to schedule MRI and follow-up with orthopedic group - Conduct nonstrenuous physical activity as tolerated using knee and ankle symptoms as a guide

## 2021-11-09 NOTE — Progress Notes (Signed)
Primary Care / Sports Medicine Office Visit  Patient Information:  Patient ID: Brandon Perez, male DOB: November 24, 1955 Age: 66 y.o. MRN: [id number]   Brandon Perez is a pleasant 66 y.o. male presenting with the following:  Chief Complaint  Patient presents with   Foot Pain    Left foot, was climbing ladder 2 weeks ago, missed step, left ankle swollen. Pt C/O heel pain, was DX with heel spur years ago.     Vitals:   11/09/21 1319  BP: 132/84  Pulse: 78  SpO2: 97%   Vitals:   11/09/21 1319  Weight: 268 lb (121.6 kg)  Height: 5\' 8"  (1.727 m)   Body mass index is 40.75 kg/m.  No results found.   Independent interpretation of notes and tests performed by another provider:   None  Procedures performed:   Procedure:  Injection of right knee joint under ultrasound guidance. Ultrasound guidance utilized for anterolateral approach to the right knee, no effusion noted Samsung HS60 device utilized with permanent recording / reporting. Verbal informed consent obtained and verified. Skin prepped in a sterile fashion. Ethyl chloride for topical local analgesia.  Completed without difficulty and tolerated well. Medication: triamcinolone acetonide 40 mg/mL suspension for injection 1 mL total and 2 mL lidocaine 1% without epinephrine utilized for needle placement anesthetic Advised to contact for fevers/chills, erythema, induration, drainage, or persistent bleeding.   Pertinent History, Exam, Impression, and Recommendations:   Problem List Items Addressed This Visit       Musculoskeletal and Integument   Primary osteoarthritis of right knee - Primary    Chronic issue, actively symptomatic, feels that symptoms have progressively worsened over the past few weeks given comorbid left ankle issues.  Examination reveals tenderness about the patellofemoral and medial tibiofemoral compartments, treatment strategies were discussed, given that he is dosing of diclofenac scheduled and still  symptomatic, he did elect to proceed with corticosteroid injection to the right knee under ultrasound guidance.  Post care reviewed, from a medication management standpoint have advised to wean and discontinuation of diclofenac as he is able to do so.  Home-based rehab advised.      Relevant Orders   [place] LIMITED JOINT SPACE STRUCTURES LOW RIGHT   Achilles tendon tear, left, initial encounter    Chronic conditioning with progression/worsening.  Patient has a known longstanding history of calcific Achilles tendinopathy of the left lower extremity, describes episode roughly 2 weeks prior when stepping down off of a ladder, left forefoot was hung up on a ladder rung, describes forced maximal dorsiflexion associated with pain, pop sensation, swelling about the ankle, and subsequent dysfunction.  Felt a "charley horse "at his left ankle posteriorly, is unable to plantarflex with ambulation, denies any ecchymosis.  Examination reveals mild swelling diffusely about the ankle when compared to contralateral, there is step-off deformation superior to the calcaneus and a palpable defect is noted, negative Homans, he has a positive Thompson's test, he is able to actively plantarflex while resting his knee on exam table.  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  Activity modification discussed over the interim, cam boot offered, denies any stumbling/gait abnormalities beyond what was mentioned.      Relevant Orders   MR ANKLE LEFT WO CONTRAST   Ambulatory referral to Orthopedic Surgery     Orders & Medications Meds ordered  this encounter  Medications   triamcinolone acetonide (KENALOG-40) injection 40 mg   Orders Placed This Encounter  Procedures   [place] LIMITED JOINT SPACE STRUCTURES LOW RIGHT   MR ANKLE LEFT WO CONTRAST    Ambulatory referral to Orthopedic Surgery     No follow-ups on file.     Brandon Banana, MD   Primary Care Sports Medicine [facility]

## 2021-11-10 NOTE — Telephone Encounter (Signed)
Requested medication (s) are due for refill today: yes  Requested medication (s) are on the active medication list: yes  Last refill: 08/19/21  Future visit scheduled: yes  Notes to clinic:  Unable to refill per protocol due to failed labs, no updated results.      Requested Prescriptions  Pending Prescriptions Disp Refills   diclofenac (VOLTAREN) 75 MG EC tablet [Pharmacy Med Name: Diclofenac Sodium 75 MG Oral Tablet Delayed Release] 180 tablet 0    Sig: Take 1 tablet by mouth twice daily     Analgesics:  NSAIDS Failed - 11/09/2021  8:40 AM      Failed - Manual Review: Labs are only required if the patient has taken medication for more than 8 weeks.      Failed - HGB in normal range and within 360 days    No results found for: "HGB", "HGBKUC", "HGBPOCKUC", "HGBOTHER", "TOTHGB", "HGBPLASMA"       Failed - PLT in normal range and within 360 days    No results found for: "PLT", "PLTCOUNTKUC", "LABPLAT", "POCPLA"       Failed - HCT in normal range and within 360 days    No results found for: "HCT", "HCTKUC", "SRHCT"       Passed - Cr in normal range and within 360 days    Creatinine, Ser  Date Value Ref Range Status  06/12/2021 1.22 0.76 - 1.27 mg/dL Final         Passed - eGFR is 30 or above and within 360 days    GFR calc Af Amer  Date Value Ref Range Status  03/14/2019 80 >59 mL/min/1.73 Final   GFR calc non Af Amer  Date Value Ref Range Status  03/14/2019 69 >59 mL/min/1.73 Final   eGFR  Date Value Ref Range Status  06/12/2021 65 >59 mL/min/1.73 Final         Passed - Patient is not pregnant      Passed - Valid encounter within last 12 months    Recent Outpatient Visits           Yesterday Primary osteoarthritis of right knee   [facility] [place], Earley Abide, MD   2 months ago Primary osteoarthritis of left knee   [facility] at [place], Earley Abide, MD   5  months ago Essential hypertension   [facility] at [place], [place], MD   6 months ago Primary osteoarthritis of left knee   [facility] at [place], Earley Abide, MD   6 months ago Primary osteoarthritis of both knees   [facility] at [place], [place], MD       Future Appointments             In 1 month Juline Patch, MD [place] [facility] at [facility], [facility]

## 2021-11-18 ENCOUNTER — Ambulatory Visit
Admission: RE | Admit: 2021-11-18 | Discharge: 2021-11-18 | Disposition: A | Discharge status: Home / Self Care | Payer: PPO | Source: Ambulatory Visit | Attending: Family Medicine | Admitting: Family Medicine

## 2021-11-18 DIAGNOSIS — M7989 Other specified soft tissue disorders: Secondary | ICD-10-CM | POA: Diagnosis not present

## 2021-11-18 DIAGNOSIS — Z01818 Encounter for other preprocedural examination: Secondary | ICD-10-CM | POA: Diagnosis not present

## 2021-11-18 DIAGNOSIS — S86012A Strain of left Achilles tendon, initial encounter: Secondary | ICD-10-CM | POA: Diagnosis not present

## 2021-11-18 DIAGNOSIS — S93422A Sprain of deltoid ligament of left ankle, initial encounter: Secondary | ICD-10-CM | POA: Diagnosis not present

## 2021-11-18 DIAGNOSIS — M7732 Calcaneal spur, left foot: Secondary | ICD-10-CM | POA: Diagnosis not present

## 2021-11-18 DIAGNOSIS — S0590XD Unspecified injury of unspecified eye and orbit, subsequent encounter: Secondary | ICD-10-CM | POA: Insufficient documentation

## 2021-11-22 ENCOUNTER — Other Ambulatory Visit: Payer: Self-pay | Admitting: Family Medicine

## 2021-11-22 DIAGNOSIS — M1712 Unilateral primary osteoarthritis, left knee: Secondary | ICD-10-CM

## 2021-11-23 ENCOUNTER — Telehealth: Payer: Self-pay

## 2021-11-23 NOTE — Telephone Encounter (Signed)
Scheduled appt with Dr Vickki Muff 12/02/21 @ 9:15- pt aware

## 2021-11-23 NOTE — Telephone Encounter (Signed)
No on med list. Requested Prescriptions  Pending Prescriptions Disp Refills  . diclofenac (VOLTAREN) 75 MG EC tablet [Pharmacy Med Name: Diclofenac Sodium 75 MG Oral Tablet Delayed Release] 180 tablet 0    Sig: Take 1 tablet by mouth twice daily     Analgesics:  NSAIDS Failed - 11/22/2021  8:25 AM      Failed - Manual Review: Labs are only required if the patient has taken medication for more than 8 weeks.      Failed - HGB in normal range and within 360 days    No results found for: "HGB", "HGBKUC", "HGBPOCKUC", "HGBOTHER", "TOTHGB", "HGBPLASMA"       Failed - PLT in normal range and within 360 days    No results found for: "PLT", "PLTCOUNTKUC", "LABPLAT", "POCPLA"       Failed - HCT in normal range and within 360 days    No results found for: "HCT", "HCTKUC", "SRHCT"       Passed - Cr in normal range and within 360 days    Creatinine, Ser  Date Value Ref Range Status  06/12/2021 1.22 0.76 - 1.27 mg/dL Final         Passed - eGFR is 30 or above and within 360 days    GFR calc Af Amer  Date Value Ref Range Status  03/14/2019 80 >59 mL/min/1.73 Final   GFR calc non Af Amer  Date Value Ref Range Status  03/14/2019 69 >59 mL/min/1.73 Final   eGFR  Date Value Ref Range Status  06/12/2021 65 >59 mL/min/1.73 Final         Passed - Patient is not pregnant      Passed - Valid encounter within last 12 months    Recent Outpatient Visits          2 weeks ago Primary osteoarthritis of right knee   [facility] [place], Earley Abide, MD   3 months ago Primary osteoarthritis of left knee   [facility] at [place], Earley Abide, MD   5 months ago Essential hypertension   [place] at [place], [place], MD   6 months ago Primary osteoarthritis of left knee   [facility] at [place], Earley Abide,  MD   6 months ago Primary osteoarthritis of both knees   [place] [facility] at [place], [place], MD      Future Appointments            In 3 weeks Juline Patch, MD [place] [facility] at [facility], [facility]

## 2021-12-02 ENCOUNTER — Other Ambulatory Visit: Payer: Self-pay | Admitting: Family Medicine

## 2021-12-02 DIAGNOSIS — S86012A Strain of left Achilles tendon, initial encounter: Secondary | ICD-10-CM | POA: Diagnosis not present

## 2021-12-02 DIAGNOSIS — M170 Bilateral primary osteoarthritis of knee: Secondary | ICD-10-CM

## 2021-12-02 DIAGNOSIS — E119 Type 2 diabetes mellitus without complications: Secondary | ICD-10-CM | POA: Diagnosis not present

## 2021-12-02 DIAGNOSIS — M79672 Pain in left foot: Secondary | ICD-10-CM | POA: Diagnosis not present

## 2021-12-03 NOTE — Telephone Encounter (Signed)
Requested medication (s) are due for refill today - no  Requested medication (s) are on the active medication list -no  Future visit scheduled -yes  Last refill: 12/02/20  Notes to clinic: [place] lab protocol, medication no longer on current medication list   Requested Prescriptions  Pending Prescriptions Disp Refills   meloxicam (MOBIC) 15 MG tablet [Pharmacy Med Name: Meloxicam 15 MG Oral Tablet] 90 tablet 0    Sig: Take 1 tablet by mouth once daily     Analgesics:  COX2 Inhibitors Failed - 12/02/2021  1:45 PM      Failed - Manual Review: Labs are only required if the patient has taken medication for more than 8 weeks.      Failed - HGB in normal range and within 360 days    No results found for: "HGB", "HGBKUC", "HGBPOCKUC", "HGBOTHER", "TOTHGB", "HGBPLASMA"       Failed - HCT in normal range and within 360 days    No results found for: "HCT", "HCTKUC", "SRHCT"       Failed - AST in normal range and within 360 days    AST  Date Value Ref Range Status  12/02/2020 21 0 - 40 IU/L Final         Failed - ALT in normal range and within 360 days    ALT  Date Value Ref Range Status  12/02/2020 20 0 - 44 IU/L Final         Passed - Cr in normal range and within 360 days    Creatinine, Ser  Date Value Ref Range Status  06/12/2021 1.22 0.76 - 1.27 mg/dL Final         Passed - eGFR is 30 or above and within 360 days    GFR calc Af Amer  Date Value Ref Range Status  03/14/2019 80 >59 mL/min/1.73 Final   GFR calc non Af Amer  Date Value Ref Range Status  03/14/2019 69 >59 mL/min/1.73 Final   eGFR  Date Value Ref Range Status  06/12/2021 65 >59 mL/min/1.73 Final         Passed - Patient is not pregnant      Passed - Valid encounter within last 12 months    Recent Outpatient Visits           3 weeks ago Primary osteoarthritis of right knee   [facility] at [place], Earley Abide, MD   3 months ago Primary osteoarthritis  of left knee   [place] [facility] at [place], Earley Abide, MD   5 months ago Essential hypertension   [place] [facility] at [place], Deanna C, MD   6 months ago Primary osteoarthritis of left knee   [place] [facility] at [place], Earley Abide, MD   6 months ago Primary osteoarthritis of both knees   [place] [facility] at [place], [place], MD       Future Appointments             In 1 week Juline Patch, MD [facility] at [facility], [facility]               Requested Prescriptions  Pending Prescriptions Disp Refills   meloxicam (MOBIC) 15 MG tablet [Pharmacy Med Name: Meloxicam 15 MG Oral Tablet] 90 tablet 0  Sig: Take 1 tablet by mouth once daily     Analgesics:  COX2 Inhibitors Failed - 12/02/2021  1:45 PM      Failed - Manual Review: Labs are only required if the patient has taken medication for more than 8 weeks.      Failed - HGB in normal range and within 360 days    No results found for: "HGB", "HGBKUC", "HGBPOCKUC", "HGBOTHER", "TOTHGB", "HGBPLASMA"       Failed - HCT in normal range and within 360 days    No results found for: "HCT", "HCTKUC", "SRHCT"       Failed - AST in normal range and within 360 days    AST  Date Value Ref Range Status  12/02/2020 21 0 - 40 IU/L Final         Failed - ALT in normal range and within 360 days    ALT  Date Value Ref Range Status  12/02/2020 20 0 - 44 IU/L Final         Passed - Cr in normal range and within 360 days    Creatinine, Ser  Date Value Ref Range Status  06/12/2021 1.22 0.76 - 1.27 mg/dL Final         Passed - eGFR is 30 or above and within 360 days    GFR calc Af Amer  Date Value Ref Range Status  03/14/2019 80 >59 mL/min/1.73 Final   GFR calc non Af Amer  Date Value Ref Range Status  03/14/2019 69 >59  mL/min/1.73 Final   eGFR  Date Value Ref Range Status  06/12/2021 65 >59 mL/min/1.73 Final         Passed - Patient is not pregnant      Passed - Valid encounter within last 12 months    Recent Outpatient Visits           3 weeks ago Primary osteoarthritis of right knee   [place] at [place], Earley Abide, MD   3 months ago Primary osteoarthritis of left knee   [place] [facility] at [place], Earley Abide, MD   5 months ago Essential hypertension   [place] [facility] at [place], [place], MD   6 months ago Primary osteoarthritis of left knee   [facility] at [place], Earley Abide, MD   6 months ago Primary osteoarthritis of both knees   [place] [facility] at [place], [place], MD       Future Appointments             In 1 week Juline Patch, MD [facility] at [facility], [facility]

## 2021-12-14 ENCOUNTER — Ambulatory Visit (INDEPENDENT_AMBULATORY_CARE_PROVIDER_SITE_OTHER): Payer: PPO | Admitting: Family Medicine

## 2021-12-14 ENCOUNTER — Encounter: Payer: Self-pay | Admitting: Family Medicine

## 2021-12-14 VITALS — BP 120/79 | HR 84 | Ht 68.0 in | Wt 272.0 lb

## 2021-12-14 DIAGNOSIS — E782 Mixed hyperlipidemia: Secondary | ICD-10-CM | POA: Diagnosis not present

## 2021-12-14 DIAGNOSIS — E119 Type 2 diabetes mellitus without complications: Secondary | ICD-10-CM

## 2021-12-14 DIAGNOSIS — Z23 Encounter for immunization: Secondary | ICD-10-CM | POA: Diagnosis not present

## 2021-12-14 DIAGNOSIS — I10 Essential (primary) hypertension: Secondary | ICD-10-CM

## 2021-12-14 MED ORDER — ATORVASTATIN CALCIUM 10 MG PO TABS: 10.0000 mg | ORAL_TABLET | Freq: Every day | ORAL | 1 refills | Status: DC

## 2021-12-14 MED ORDER — METFORMIN HCL 500 MG PO TABS: 500.0000 mg | ORAL_TABLET | Freq: Every day | ORAL | 1 refills | Status: DC

## 2021-12-14 MED ORDER — LISINOPRIL 10 MG PO TABS: 10.0000 mg | ORAL_TABLET | Freq: Every day | ORAL | 1 refills | Status: DC

## 2021-12-14 NOTE — Progress Notes (Signed)
Date:  12/14/2021   Name:  Brandon Perez   DOB:  [birth year]   MRN:  [id number]   Chief Complaint: Hypertension, Hyperlipidemia, Prediabetes, and Flu Vaccine  Hypertension This is a chronic problem. The current episode started more than 1 year ago. The problem has been gradually improving since onset. The problem is controlled. Pertinent negatives include no anxiety, blurred vision, chest pain, headaches, malaise/fatigue, neck pain, orthopnea, palpitations, peripheral edema, PND, shortness of breath or sweats. There are no associated agents to hypertension. Risk factors for coronary artery disease include dyslipidemia, diabetes mellitus and obesity. Past treatments include ACE inhibitors. There are no compliance problems.  There is no history of angina, kidney disease, CAD/MI, CVA, heart failure, left ventricular hypertrophy, PVD or retinopathy. There is no history of chronic renal disease, a hypertension causing med or renovascular disease.  Hyperlipidemia The problem is controlled. Recent lipid tests were reviewed and are normal. Exacerbating diseases include obesity. He has no history of chronic renal disease, diabetes, hypothyroidism, liver disease or nephrotic syndrome. Pertinent negatives include no chest pain, myalgias or shortness of breath. Current antihyperlipidemic treatment includes statins. The current treatment provides moderate improvement of lipids. There are no compliance problems.  There are no known risk factors for coronary artery disease.  Diabetes He presents for his follow-up diabetic visit. He has type 2 diabetes mellitus. Pertinent negatives for hypoglycemia include no dizziness, headaches, nervousness/anxiousness or sweats. Pertinent negatives for diabetes include no blurred vision, no chest pain and no polydipsia. Pertinent negatives for diabetic complications include no CVA, PVD or retinopathy. Current diabetic treatment includes oral agent (monotherapy). His weight is  stable. He is following a generally healthy diet. Meal planning includes avoidance of concentrated sweets and carbohydrate counting. He participates in exercise intermittently. An ACE inhibitor/angiotensin II receptor blocker is being taken.    Lab Results  Component Value Date   NA 139 06/12/2021   K 4.6 06/12/2021   CO2 21 06/12/2021   GLUCOSE 103 (H) 06/12/2021   BUN 17 06/12/2021   CREATININE 1.22 06/12/2021   CALCIUM 9.2 06/12/2021   EGFR 65 06/12/2021   GFRNONAA 69 03/14/2019   Lab Results  Component Value Date   CHOL 157 06/12/2021   HDL 36 (L) 06/12/2021   LDLCALC 87 06/12/2021   TRIG 202 (H) 06/12/2021   No results found for: "TSH" Lab Results  Component Value Date   HGBA1C 6.3 (H) 08/17/2021   No results found for: "WBC", "HGB", "HCT", "MCV", "PLT" Lab Results  Component Value Date   ALT 20 12/02/2020   AST 21 12/02/2020   ALKPHOS 75 12/02/2020   BILITOT 0.5 12/02/2020   No results found for: "25OHVITD2", "25OHVITD3", "VD25OH"   Review of Systems  Constitutional:  Negative for chills, fever and malaise/fatigue.  HENT:  Negative for drooling, ear discharge, ear pain and sore throat.   Eyes:  Negative for blurred vision.  Respiratory:  Negative for cough, shortness of breath and wheezing.   Cardiovascular:  Negative for chest pain, palpitations, orthopnea, leg swelling and PND.  Gastrointestinal:  Negative for abdominal pain, blood in stool, constipation, diarrhea and nausea.  Endocrine: Negative for polydipsia.  Genitourinary:  Negative for dysuria, frequency, hematuria and urgency.  Musculoskeletal:  Negative for back pain, myalgias and neck pain.  Skin:  Negative for rash.  Allergic/Immunologic: Negative for environmental allergies.  Neurological:  Negative for dizziness and headaches.  Hematological:  Does not bruise/bleed easily.  Psychiatric/Behavioral:  Negative for suicidal ideas. The patient is not  nervous/anxious.     Patient Active Problem List    Diagnosis Date Noted   Achilles tendon tear, left, initial encounter 11/09/2021   Primary osteoarthritis of left knee 05/14/2021   Primary osteoarthritis of right knee 05/14/2021   Calcific Achilles tendinitis of left lower extremity 05/14/2021    Allergies  Allergen Reactions   Penicillin G Other (See Comments)    As a child    Past Surgical History:  Procedure Laterality Date   COLONOSCOPY  02/23/2012   normal    Social History   Tobacco Use   Smoking status: Former    Packs/day: 1.00    Years: 30.00    Total pack years: 30.00    Types: Cigarettes    Quit date: 05/24/2019    Years since quitting: 2.5   Smokeless tobacco: Never  Vaping Use   Vaping Use: Never used  Substance Use Topics   Alcohol use: No    Alcohol/week: 0.0 standard drinks of alcohol   Drug use: No     Medication list has been reviewed and updated.  Current Meds  Medication Sig   aspirin 81 MG tablet Take 81 mg by mouth daily.   atorvastatin (LIPITOR) 10 MG tablet Take 1 tablet (10 mg total) by mouth daily.   lisinopril (ZESTRIL) 10 MG tablet Take 1 tablet (10 mg total) by mouth daily.   metFORMIN (GLUCOPHAGE) 500 MG tablet Take 1 tablet by mouth once daily with breakfast       12/14/2021    9:09 AM 08/19/2021    8:36 AM 06/12/2021    9:40 AM 05/14/2021    8:32 AM  GAD 7 : Generalized Anxiety Score  Nervous, Anxious, on Edge 0 0 0 0  Control/stop worrying 0 0 0 0  Worry too much - different things 0 0 0 0  Trouble relaxing 0 0 0 0  Restless 0 0 0 0  Easily annoyed or irritable 0 0 0 0  Afraid - awful might happen 0 0 0 0  Total GAD 7 Score 0 0 0 0  Anxiety Difficulty Not difficult at all Not difficult at all Not difficult at all        12/14/2021    9:09 AM 08/19/2021    8:36 AM 06/12/2021    9:40 AM  Depression screen PHQ 2/9  Decreased Interest 0 0 0  Down, Depressed, Hopeless 0 0 0  PHQ - 2 Score 0 0 0  Altered sleeping 0 0 0  Tired, decreased energy 0 0 0  Change in  appetite 0 0 0  Feeling bad or failure about yourself  0 0 0  Trouble concentrating 0 0 0  Moving slowly or fidgety/restless 0 0 0  Suicidal thoughts 0 0 0  PHQ-9 Score 0 0 0  Difficult doing work/chores Not difficult at all Not difficult at all Not difficult at all    BP Readings from Last 3 Encounters:  12/14/21 120/79  11/09/21 132/84  08/19/21 120/80    Physical Exam Vitals and nursing note reviewed.  HENT:     Head: Normocephalic.     Right Ear: Tympanic membrane and external ear normal.     Left Ear: Tympanic membrane and external ear normal.     Nose: Nose normal.     Mouth/Throat:     Mouth: Mucous membranes are moist.  Eyes:     General: No scleral icterus.       Right eye: No discharge.  Left eye: No discharge.     Conjunctiva/sclera: Conjunctivae normal.     Pupils: Pupils are equal, round, and reactive to light.  Neck:     Thyroid: No thyromegaly.     Vascular: No JVD.     Trachea: No tracheal deviation.  Cardiovascular:     Rate and Rhythm: Normal rate and regular rhythm.     Heart sounds: Normal heart sounds. No murmur heard.    No friction rub. No gallop.  Pulmonary:     Effort: No respiratory distress.     Breath sounds: Normal breath sounds. No wheezing or rales.  Abdominal:     General: Bowel sounds are normal.     Palpations: Abdomen is soft. There is no mass.     Tenderness: There is no abdominal tenderness. There is no guarding or rebound.  Musculoskeletal:        General: No tenderness. Normal range of motion.     Cervical back: Neck supple.  Lymphadenopathy:     Cervical: No cervical adenopathy.  Skin:    General: Skin is warm.     Findings: No rash.  Neurological:     Mental Status: He is alert.     Wt Readings from Last 3 Encounters:  12/14/21 272 lb (123.4 kg)  11/09/21 268 lb (121.6 kg)  08/19/21 272 lb (123.4 kg)    BP 120/79   Pulse 84   Ht '5\' 8"'  (1.727 m)   Wt 272 lb (123.4 kg)   SpO2 96%   BMI 41.36 kg/m    Assessment and Plan:  1. Essential hypertension Chronic.  Controlled.  Stable.  Blood pressure 120/79.  Asymptomatic.  Tolerating medication well.  Continue lisinopril 10 mg once a day.  Will check CMP for electrolytes and GFR. - lisinopril (ZESTRIL) 10 MG tablet; Take 1 tablet (10 mg total) by mouth daily.  Dispense: 90 tablet; Refill: 1 - Comprehensive Metabolic Panel (CMET)  2. Mixed hyperlipidemia Controlled.  Chronic.  Stable.  Continue atorvastatin 10 mg once a day.  We will recheck in 4 months. - atorvastatin (LIPITOR) 10 MG tablet; Take 1 tablet (10 mg total) by mouth daily.  Dispense: 90 tablet; Refill: 1  3. New onset type 2 diabetes mellitus (HCC) Chronic.  Controlled.  Stable.  Last A1c in prediabetic range at 6.4.  Continue metformin 500 mg once a day.  We will recheck A1c and go to twice a day if elevated.  In the meantime we will also check CMP and microalbuminuria.  Patient has been encouraged to go to ophthalmology for retina exam. - metFORMIN (GLUCOPHAGE) 500 MG tablet; Take 1 tablet (500 mg total) by mouth daily with breakfast.  Dispense: 90 tablet; Refill: 1 - HgB A1c - Comprehensive Metabolic Panel (CMET) - Microalbumin / creatinine urine ratio - Ambulatory referral to Ophthalmology  4. Need for immunization against influenza Discussed and administered. - Flu Vaccine QUAD High Dose(Fluad)    Otilio Miu, MD

## 2021-12-16 LAB — COMPREHENSIVE METABOLIC PANEL
ALT: 19 IU/L (ref 0–44)
AST: 18 IU/L (ref 0–40)
Albumin/Globulin Ratio: 1.6 (ref 1.2–2.2)
Albumin: 4.4 g/dL (ref 3.9–4.9)
Alkaline Phosphatase: 83 IU/L (ref 44–121)
BUN/Creatinine Ratio: 15 (ref 10–24)
BUN: 17 mg/dL (ref 8–27)
Bilirubin Total: 0.2 mg/dL (ref 0.0–1.2)
CO2: 21 mmol/L (ref 20–29)
Calcium: 9.5 mg/dL (ref 8.6–10.2)
Chloride: 100 mmol/L (ref 96–106)
Creatinine, Ser: 1.15 mg/dL (ref 0.76–1.27)
Globulin, Total: 2.8 g/dL (ref 1.5–4.5)
Glucose: 91 mg/dL (ref 70–99)
Potassium: 4.5 mmol/L (ref 3.5–5.2)
Sodium: 139 mmol/L (ref 134–144)
Total Protein: 7.2 g/dL (ref 6.0–8.5)
eGFR: 69 mL/min/{1.73_m2} (ref >59)

## 2021-12-16 LAB — MICROALBUMIN / CREATININE URINE RATIO
Creatinine, Urine: 138.9 mg/dL
Microalb/Creat Ratio: <2 mg/g creat (ref 0–29)
Microalbumin, Urine: <3 ug/mL

## 2021-12-16 LAB — HEMOGLOBIN A1C
Est. average glucose Bld gHb Est-mCnc: 128 mg/dL
Hgb A1c MFr Bld: 6.1 % — ABNORMAL HIGH (ref 4.8–5.6)

## 2022-01-04 DIAGNOSIS — E119 Type 2 diabetes mellitus without complications: Secondary | ICD-10-CM | POA: Diagnosis not present

## 2022-01-04 LAB — HM DIABETES EYE EXAM

## 2022-01-11 ENCOUNTER — Telehealth: Payer: Self-pay | Admitting: Family Medicine

## 2022-01-11 DIAGNOSIS — M79672 Pain in left foot: Secondary | ICD-10-CM | POA: Diagnosis not present

## 2022-01-11 DIAGNOSIS — E119 Type 2 diabetes mellitus without complications: Secondary | ICD-10-CM | POA: Diagnosis not present

## 2022-01-11 DIAGNOSIS — S86012D Strain of left Achilles tendon, subsequent encounter: Secondary | ICD-10-CM | POA: Diagnosis not present

## 2022-01-11 NOTE — Telephone Encounter (Signed)
Pt is coming in tomorrow at 9am for injection

## 2022-01-11 NOTE — Telephone Encounter (Signed)
Copied from CRM [id number]. Topic: Appointment Scheduling - Scheduling Inquiry for Clinic >> Jan 11, 2022  2:06 PM Franchot Heidelberg wrote: Reason for CRM: Pt called and wants to schedule another cortisone shot appt for his knee [phone]  Best contact:

## 2022-01-12 ENCOUNTER — Ambulatory Visit (INDEPENDENT_AMBULATORY_CARE_PROVIDER_SITE_OTHER): Payer: PPO | Admitting: Family Medicine

## 2022-01-12 ENCOUNTER — Inpatient Hospital Stay: Payer: Self-pay | Admitting: Radiology

## 2022-01-12 ENCOUNTER — Encounter: Payer: Self-pay | Admitting: Family Medicine

## 2022-01-12 VITALS — BP 120/80 | HR 86 | Ht 68.0 in | Wt 270.0 lb

## 2022-01-12 DIAGNOSIS — M1712 Unilateral primary osteoarthritis, left knee: Secondary | ICD-10-CM

## 2022-01-12 MED ORDER — TRIAMCINOLONE ACETONIDE 40 MG/ML IJ SUSP
40.0000 mg | Freq: Once | INTRAMUSCULAR | Status: AC
  Administered 2022-01-12: 40 mg via INTRAMUSCULAR

## 2022-01-12 NOTE — Patient Instructions (Signed)
You have just been given a cortisone injection to reduce pain and inflammation. After the injection you may notice immediate relief of pain as a result of the Lidocaine. It is important to rest the area of the injection for 24 to 48 hours after the injection. There is a possibility of some temporary increased discomfort and swelling for up to 72 hours until the cortisone begins to work. If you do have pain, simply rest the joint and use ice. If you can tolerate over the counter medications, you can try Tylenol, Aleve, or Advil for added relief per package instructions. - Follow-up as-needed 

## 2022-01-12 NOTE — Progress Notes (Signed)
     Primary Care / Sports Medicine Office Visit  Patient Information:  Patient ID: Brandon Perez, male DOB: 19-Feb-1956 Age: 66 y.o. MRN: [id number]   Brandon Perez is a pleasant 66 y.o. male presenting with the following:  Chief Complaint  Patient presents with   Primary osteoarthritis of left knee    Vitals:   01/12/22 0849  BP: 120/80  Pulse: 86  SpO2: 98%   Vitals:   01/12/22 0849  Weight: 270 lb (122.5 kg)  Height: 5\' 8"  (1.727 m)   Body mass index is 41.05 kg/m.  No results found.   Independent interpretation of notes and tests performed by another provider:   None  Procedures performed:   Procedure:  Injection of left intraarticular knee under ultrasound guidance. Ultrasound guidance utilized for out of plane approach to the anterolateral left knee, joint space noted, no effusion. Samsung HS60 device utilized with permanent recording / reporting. Verbal informed consent obtained and verified. Skin prepped in a sterile fashion. Ethyl chloride for topical local analgesia.  Completed without difficulty and tolerated well. Medication: triamcinolone acetonide 40 mg/mL suspension for injection 1 mL total and 2 mL lidocaine 1% without epinephrine utilized for needle placement anesthetic Advised to contact for fevers/chills, erythema, induration, drainage, or persistent bleeding.   Pertinent History, Exam, Impression, and Recommendations:   Problem List Items Addressed This Visit       Musculoskeletal and Integument   Primary osteoarthritis of left knee - Primary    Chronic issue with worsened pain over the past several weeks, further exacerbated by ipsilateral CAM boot usage due to Achilles tear. He has just come out of the boot but continues to have anteromedial left knee pain, no swelling. He is tender at the medial joint line, deep flexion recreates anterior pain. Of note, he had excellent, and persistent, relief from cortisone to the contralateral knee from  11/09/21.  He did elect to proceed with ultrasound guided left knee intraarticular cortisone injection which he tolerated well. I provided AAOS knee conditioning material for him to perform and follow-up as-needed.      Relevant Orders   11/11/21 LIMITED JOINT SPACE STRUCTURES LOW LEFT     Orders & Medications No orders of the defined types were placed in this encounter.  Orders Placed This Encounter  Procedures   [place] LIMITED JOINT SPACE STRUCTURES LOW LEFT     Return if symptoms worsen or fail to improve.     [place], MD   Primary Care Sports Medicine [facility]

## 2022-01-12 NOTE — Assessment & Plan Note (Signed)
Chronic issue with worsened pain over the past several weeks, further exacerbated by ipsilateral CAM boot usage due to Achilles tear. He has just come out of the boot but continues to have anteromedial left knee pain, no swelling. He is tender at the medial joint line, deep flexion recreates anterior pain. Of note, he had excellent, and persistent, relief from cortisone to the contralateral knee from 11/09/21.  He did elect to proceed with ultrasound guided left knee intraarticular cortisone injection which he tolerated well. I provided AAOS knee conditioning material for him to perform and follow-up as-needed.

## 2022-01-12 NOTE — Addendum Note (Signed)
Addended by: Jerl Santos on: 01/12/2022 04:16 PM   Modules accepted: Orders

## 2022-04-16 ENCOUNTER — Encounter: Payer: Self-pay | Admitting: Family Medicine

## 2022-04-16 ENCOUNTER — Ambulatory Visit (INDEPENDENT_AMBULATORY_CARE_PROVIDER_SITE_OTHER): Payer: PPO | Admitting: Family Medicine

## 2022-04-16 VITALS — BP 122/78 | HR 79 | Ht 68.0 in | Wt 276.0 lb

## 2022-04-16 DIAGNOSIS — I10 Essential (primary) hypertension: Secondary | ICD-10-CM

## 2022-04-16 DIAGNOSIS — E782 Mixed hyperlipidemia: Secondary | ICD-10-CM | POA: Diagnosis not present

## 2022-04-16 DIAGNOSIS — E119 Type 2 diabetes mellitus without complications: Secondary | ICD-10-CM

## 2022-04-16 MED ORDER — METFORMIN HCL 500 MG PO TABS: 500.0000 mg | ORAL_TABLET | Freq: Every day | ORAL | 1 refills | Status: DC

## 2022-04-16 MED ORDER — ATORVASTATIN CALCIUM 10 MG PO TABS: 10.0000 mg | ORAL_TABLET | Freq: Every day | ORAL | 1 refills | Status: DC

## 2022-04-16 MED ORDER — LISINOPRIL 10 MG PO TABS: 10.0000 mg | ORAL_TABLET | Freq: Every day | ORAL | 1 refills | Status: DC

## 2022-04-16 NOTE — Progress Notes (Signed)
Date:  04/16/2022   Name:  Brandon Perez   DOB:  [birth year]   MRN:  [id number]   Chief Complaint: Prediabetes, Hyperlipidemia, and Hypertension  Hyperlipidemia This is a chronic problem. The current episode started more than 1 year ago. The problem is controlled. Recent lipid tests were reviewed and are normal. He has no history of chronic renal disease, diabetes, hypothyroidism, liver disease, obesity or nephrotic syndrome. There are no known factors aggravating his hyperlipidemia. Pertinent negatives include no chest pain, focal sensory loss, focal weakness, leg pain, myalgias or shortness of breath. Current antihyperlipidemic treatment includes statins. The current treatment provides moderate improvement of lipids. There are no compliance problems.  Risk factors for coronary artery disease include diabetes mellitus and hypertension.  Hypertension This is a chronic problem. The current episode started more than 1 year ago. The problem has been gradually improving since onset. The problem is controlled. Pertinent negatives include no chest pain, headaches, malaise/fatigue, neck pain, orthopnea, palpitations or shortness of breath. There are no associated agents to hypertension. Past treatments include ACE inhibitors. The current treatment provides moderate improvement. There are no compliance problems.  There is no history of angina, CVA or PVD. There is no history of chronic renal disease, a hypertension causing med or renovascular disease.  Diabetes He presents for his follow-up diabetic visit. He has type 2 diabetes mellitus. There are no hypoglycemic associated symptoms. Pertinent negatives for hypoglycemia include no dizziness, headaches or nervousness/anxiousness. Pertinent negatives for diabetes include no chest pain, no fatigue, no foot paresthesias, no polydipsia and no polyuria. There are no hypoglycemic complications. Symptoms are stable. There are no diabetic complications. Pertinent  negatives for diabetic complications include no CVA or PVD. Risk factors for coronary artery disease include dyslipidemia, diabetes mellitus and hypertension. Current diabetic treatment includes oral agent (monotherapy) (metformen). He is following a generally healthy diet. Meal planning includes avoidance of concentrated sweets and carbohydrate counting. An ACE inhibitor/angiotensin II receptor blocker is being taken.    Lab Results  Component Value Date   NA 139 12/14/2021   K 4.5 12/14/2021   CO2 21 12/14/2021   GLUCOSE 91 12/14/2021   BUN 17 12/14/2021   CREATININE 1.15 12/14/2021   CALCIUM 9.5 12/14/2021   EGFR 69 12/14/2021   GFRNONAA 69 03/14/2019   Lab Results  Component Value Date   CHOL 157 06/12/2021   HDL 36 (L) 06/12/2021   LDLCALC 87 06/12/2021   TRIG 202 (H) 06/12/2021   No results found for: "TSH" Lab Results  Component Value Date   HGBA1C 6.1 (H) 12/14/2021   No results found for: "WBC", "HGB", "HCT", "MCV", "PLT" Lab Results  Component Value Date   ALT 19 12/14/2021   AST 18 12/14/2021   ALKPHOS 83 12/14/2021   BILITOT 0.2 12/14/2021   No results found for: "25OHVITD2", "25OHVITD3", "VD25OH"   Review of Systems  Constitutional:  Negative for chills, fatigue, fever and malaise/fatigue.  HENT:  Negative for drooling, ear discharge, ear pain and sore throat.   Respiratory:  Negative for cough, shortness of breath and wheezing.   Cardiovascular:  Negative for chest pain, palpitations, orthopnea and leg swelling.  Gastrointestinal:  Negative for abdominal pain, blood in stool, constipation, diarrhea and nausea.  Endocrine: Negative for polydipsia and polyuria.  Genitourinary:  Negative for dysuria, frequency, hematuria and urgency.  Musculoskeletal:  Negative for back pain, myalgias and neck pain.  Skin:  Negative for rash.  Allergic/Immunologic: Negative for environmental allergies.  Neurological:  Negative  for dizziness, focal weakness and headaches.   Hematological:  Does not bruise/bleed easily.  Psychiatric/Behavioral:  Negative for suicidal ideas. The patient is not nervous/anxious.     Patient Active Problem List   Diagnosis Date Noted   Achilles tendon tear, left, initial encounter 11/09/2021   Primary osteoarthritis of left knee 05/14/2021   Primary osteoarthritis of right knee 05/14/2021   Calcific Achilles tendinitis of left lower extremity 05/14/2021    Allergies  Allergen Reactions   Penicillin G Other (See Comments)    As a child    Past Surgical History:  Procedure Laterality Date   COLONOSCOPY  02/23/2012   normal    Social History   Tobacco Use   Smoking status: Former    Packs/day: 1.00    Years: 30.00    Total pack years: 30.00    Types: Cigarettes    Quit date: 05/24/2019    Years since quitting: 2.8   Smokeless tobacco: Never  Vaping Use   Vaping Use: Never used  Substance Use Topics   Alcohol use: No    Alcohol/week: 0.0 standard drinks of alcohol   Drug use: No     Medication list has been reviewed and updated.  Current Meds  Medication Sig   aspirin 81 MG tablet Take 81 mg by mouth daily.   atorvastatin (LIPITOR) 10 MG tablet Take 1 tablet (10 mg total) by mouth daily.   lisinopril (ZESTRIL) 10 MG tablet Take 1 tablet (10 mg total) by mouth daily.   metFORMIN (GLUCOPHAGE) 500 MG tablet Take 1 tablet (500 mg total) by mouth daily with breakfast.       04/16/2022    9:52 AM 12/14/2021    9:09 AM 08/19/2021    8:36 AM 06/12/2021    9:40 AM  GAD 7 : Generalized Anxiety Score  Nervous, Anxious, on Edge 0 0 0 0  Control/stop worrying 0 0 0 0  Worry too much - different things 0 0 0 0  Trouble relaxing 0 0 0 0  Restless 0 0 0 0  Easily annoyed or irritable 0 0 0 0  Afraid - awful might happen 0 0 0 0  Total GAD 7 Score 0 0 0 0  Anxiety Difficulty Not difficult at all Not difficult at all Not difficult at all Not difficult at all       04/16/2022    9:52 AM 12/14/2021    9:09 AM  08/19/2021    8:36 AM  Depression screen PHQ 2/9  Decreased Interest 0 0 0  Down, Depressed, Hopeless 0 0 0  PHQ - 2 Score 0 0 0  Altered sleeping 0 0 0  Tired, decreased energy 0 0 0  Change in appetite 0 0 0  Feeling bad or failure about yourself  0 0 0  Trouble concentrating 0 0 0  Moving slowly or fidgety/restless 0 0 0  Suicidal thoughts 0 0 0  PHQ-9 Score 0 0 0  Difficult doing work/chores Not difficult at all Not difficult at all Not difficult at all    BP Readings from Last 3 Encounters:  04/16/22 122/78  01/12/22 120/80  12/14/21 120/79    Physical Exam Vitals and nursing note reviewed.  HENT:     Head: Normocephalic.     Right Ear: Tympanic membrane and external ear normal.     Left Ear: Tympanic membrane and external ear normal.     Nose: Nose normal. No congestion or rhinorrhea.  Mouth/Throat:     Pharynx: No oropharyngeal exudate or posterior oropharyngeal erythema.  Eyes:     General: No scleral icterus.       Right eye: No discharge.        Left eye: No discharge.     Conjunctiva/sclera: Conjunctivae normal.     Pupils: Pupils are equal, round, and reactive to light.  Neck:     Thyroid: No thyromegaly.     Vascular: No JVD.     Trachea: No tracheal deviation.  Cardiovascular:     Rate and Rhythm: Normal rate and regular rhythm.     Heart sounds: Normal heart sounds. No murmur heard.    No friction rub. No gallop.  Pulmonary:     Effort: No respiratory distress.     Breath sounds: Normal breath sounds. No wheezing, rhonchi or rales.  Abdominal:     General: Bowel sounds are normal.     Palpations: Abdomen is soft. There is no mass.     Tenderness: There is no abdominal tenderness. There is no guarding or rebound.  Musculoskeletal:        General: No tenderness. Normal range of motion.     Cervical back: Normal range of motion and neck supple.  Lymphadenopathy:     Cervical: No cervical adenopathy.  Skin:    General: Skin is warm.      Findings: No rash.  Neurological:     Mental Status: He is alert.     Wt Readings from Last 3 Encounters:  04/16/22 276 lb (125.2 kg)  01/12/22 270 lb (122.5 kg)  12/14/21 272 lb (123.4 kg)    BP 122/78   Pulse 79   Ht '5\' 8"'$  (1.727 m)   Wt 276 lb (125.2 kg)   SpO2 96%   BMI 41.97 kg/m   Assessment and Plan:  1. Mixed hyperlipidemia Chronic.  Controlled.  Stable.  Continue atorvastatin 10 mg once a day.  Asymptomatic.  Tolerating medications well.  Will check lipid panel for current status of LDL. - atorvastatin (LIPITOR) 10 MG tablet; Take 1 tablet (10 mg total) by mouth daily.  Dispense: 90 tablet; Refill: 1 - Lipid Panel With LDL/HDL Ratio  2. Essential hypertension Chronic.  Controlled.  Stable.  Blood pressure 122/78.  Continue lisinopril 10 mg once a day.  Will recheck and 4 months. - lisinopril (ZESTRIL) 10 MG tablet; Take 1 tablet (10 mg total) by mouth daily.  Dispense: 90 tablet; Refill: 1  3. New onset type 2 diabetes mellitus ([place]) New onset.  Doing well with current medication and tolerating well.  Will check A1c for current level of control. - metFORMIN (GLUCOPHAGE) 500 MG tablet; Take 1 tablet (500 mg total) by mouth daily with breakfast.  Dispense: 90 tablet; Refill: 1 - HgB A1c    Otilio Miu, MD

## 2022-04-17 LAB — LIPID PANEL WITH LDL/HDL RATIO
Cholesterol, Total: 165 mg/dL (ref 100–199)
HDL: 34 mg/dL — ABNORMAL LOW (ref >39)
LDL Chol Calc (NIH): 90 mg/dL (ref 0–99)
LDL/HDL Ratio: 2.6 ratio (ref 0.0–3.6)
Triglycerides: 245 mg/dL — ABNORMAL HIGH (ref 0–149)
VLDL Cholesterol Cal: 41 mg/dL — ABNORMAL HIGH (ref 5–40)

## 2022-04-17 LAB — HEMOGLOBIN A1C
Est. average glucose Bld gHb Est-mCnc: 154 mg/dL
Hgb A1c MFr Bld: 7 % — ABNORMAL HIGH (ref 4.8–5.6)

## 2022-04-29 ENCOUNTER — Ambulatory Visit (INDEPENDENT_AMBULATORY_CARE_PROVIDER_SITE_OTHER): Payer: PPO

## 2022-04-29 VITALS — Ht 68.0 in | Wt 276.0 lb

## 2022-04-29 DIAGNOSIS — Z0000 Encounter for general adult medical examination without abnormal findings: Secondary | ICD-10-CM

## 2022-04-29 NOTE — Patient Instructions (Signed)
Brandon Perez , Thank you for taking time to come for your Medicare Wellness Visit. I appreciate your ongoing commitment to your health goals. Please review the following plan we discussed and let me know if I can assist you in the future.   These are the goals we discussed:  Goals      DIET - EAT MORE FRUITS AND VEGETABLES     DIET - INCREASE WATER INTAKE     Recommend drinking 6-8 glasses of water per day         This is a list of the screening recommended for you and due dates:  Health Maintenance  Topic Date Due   COVID-19 Vaccine (5 - 2023-24 season) 05/02/2022*   Pneumonia Vaccine (1 of 1 - PCV) 05/13/2022*   Zoster (Shingles) Vaccine (1 of 2) 07/15/2022*   Screening for Lung Cancer  04/17/2023*   Colon Cancer Screening  04/17/2023*   Yearly kidney function blood test for diabetes  12/15/2022   Yearly kidney health urinalysis for diabetes  12/15/2022   Medicare Annual Wellness Visit  04/29/2023   Flu Shot  Completed   HPV Vaccine  Aged Out   DTaP/Tdap/Td vaccine  Discontinued   Hepatitis C Screening: USPSTF Recommendation to screen - Ages 18-79 yo.  Discontinued  *Topic was postponed. The date shown is not the original due date.    Advanced directives: no  Conditions/risks identified: none  Next appointment: Follow up in one year for your annual wellness visit. 05/04/23 @ 2:00 pm by phone  Preventive Care 65 Years and Older, Male  Preventive care refers to lifestyle choices and visits with your health care provider that can promote health and wellness. What does preventive care include? A yearly physical exam. This is also called an annual well check. Dental exams once or twice a year. Routine eye exams. Ask your health care provider how often you should have your eyes checked. Personal lifestyle choices, including: Daily care of your teeth and gums. Regular physical activity. Eating a healthy diet. Avoiding tobacco and drug use. Limiting alcohol use. Practicing  safe sex. Taking low doses of aspirin every day. Taking vitamin and mineral supplements as recommended by your health care provider. What happens during an annual well check? The services and screenings done by your health care provider during your annual well check will depend on your age, overall health, lifestyle risk factors, and family history of disease. Counseling  Your health care provider may ask you questions about your: Alcohol use. Tobacco use. Drug use. Emotional well-being. Home and relationship well-being. Sexual activity. Eating habits. History of falls. Memory and ability to understand (cognition). Work and work Statistician. Screening  You may have the following tests or measurements: Height, weight, and BMI. Blood pressure. Lipid and cholesterol levels. These may be checked every 5 years, or more frequently if you are over 54 years old. Skin check. Lung cancer screening. You may have this screening every year starting at age 81 if you have a 30-pack-year history of smoking and currently smoke or have quit within the past 15 years. Fecal occult blood test (FOBT) of the stool. You may have this test every year starting at age 93. Flexible sigmoidoscopy or colonoscopy. You may have a sigmoidoscopy every 5 years or a colonoscopy every 10 years starting at age 2. Prostate cancer screening. Recommendations will vary depending on your family history and other risks. Hepatitis C blood test. Hepatitis B blood test. Sexually transmitted disease (STD) testing. Diabetes screening. This  is done by checking your blood sugar (glucose) after you have not eaten for a while (fasting). You may have this done every 1-3 years. Abdominal aortic aneurysm (AAA) screening. You may need this if you are a current or former smoker. Osteoporosis. You may be screened starting at age 57 if you are at high risk. Talk with your health care provider about your test results, treatment options, and  if necessary, the need for more tests. Vaccines  Your health care provider may recommend certain vaccines, such as: Influenza vaccine. This is recommended every year. Tetanus, diphtheria, and acellular pertussis (Tdap, Td) vaccine. You may need a Td booster every 10 years. Zoster vaccine. You may need this after age 67. Pneumococcal 13-valent conjugate (PCV13) vaccine. One dose is recommended after age 67. Pneumococcal polysaccharide (PPSV23) vaccine. One dose is recommended after age 65. Talk to your health care provider about which screenings and vaccines you need and how often you need them. This information is not intended to replace advice given to you by your health care provider. Make sure you discuss any questions you have with your health care provider. Document Released: 03/07/2015 Document Revised: 10/29/2015 Document Reviewed: 12/10/2014 Elsevier Interactive Patient Education  2017 [employer] Prevention in the Home Falls can cause injuries. They can happen to people of all ages. There are many things you can do to make your home safe and to help prevent falls. What can I do on the outside of my home? Regularly fix the edges of walkways and driveways and fix any cracks. Remove anything that might make you trip as you walk through a door, such as a raised step or threshold. Trim any bushes or trees on the path to your home. Use bright outdoor lighting. Clear any walking paths of anything that might make someone trip, such as rocks or tools. Regularly check to see if handrails are loose or broken. Make sure that both sides of any steps have handrails. Any raised decks and porches should have guardrails on the edges. Have any leaves, snow, or ice cleared regularly. Use sand or salt on walking paths during winter. Clean up any spills in your garage right away. This includes oil or grease spills. What can I do in the bathroom? Use night lights. Install grab bars by the  toilet and in the tub and shower. Do not use towel bars as grab bars. Use non-skid mats or decals in the tub or shower. If you need to sit down in the shower, use a plastic, non-slip stool. Keep the floor dry. Clean up any water that spills on the floor as soon as it happens. Remove soap buildup in the tub or shower regularly. Attach bath mats securely with double-sided non-slip rug tape. Do not have throw rugs and other things on the floor that can make you trip. What can I do in the bedroom? Use night lights. Make sure that you have a light by your bed that is easy to reach. Do not use any sheets or blankets that are too big for your bed. They should not hang down onto the floor. Have a firm chair that has side arms. You can use this for support while you get dressed. Do not have throw rugs and other things on the floor that can make you trip. What can I do in the kitchen? Clean up any spills right away. Avoid walking on wet floors. Keep items that you use a lot in easy-to-reach places. If you  need to reach something above you, use a strong step stool that has a grab bar. Keep electrical cords out of the way. Do not use floor polish or wax that makes floors slippery. If you must use wax, use non-skid floor wax. Do not have throw rugs and other things on the floor that can make you trip. What can I do with my stairs? Do not leave any items on the stairs. Make sure that there are handrails on both sides of the stairs and use them. Fix handrails that are broken or loose. Make sure that handrails are as long as the stairways. Check any carpeting to make sure that it is firmly attached to the stairs. Fix any carpet that is loose or worn. Avoid having throw rugs at the top or bottom of the stairs. If you do have throw rugs, attach them to the floor with carpet tape. Make sure that you have a light switch at the top of the stairs and the bottom of the stairs. If you do not have them, ask someone  to add them for you. What else can I do to help prevent falls? Wear shoes that: Do not have high heels. Have rubber bottoms. Are comfortable and fit you well. Are closed at the toe. Do not wear sandals. If you use a stepladder: Make sure that it is fully opened. Do not climb a closed stepladder. Make sure that both sides of the stepladder are locked into place. Ask someone to hold it for you, if possible. Clearly mark and make sure that you can see: Any grab bars or handrails. First and last steps. Where the edge of each step is. Use tools that help you move around (mobility aids) if they are needed. These include: Canes. Walkers. Scooters. Crutches. Turn on the lights when you go into a dark area. Replace any light bulbs as soon as they burn out. Set up your furniture so you have a clear path. Avoid moving your furniture around. If any of your floors are uneven, fix them. If there are any pets around you, be aware of where they are. Review your medicines with your doctor. Some medicines can make you feel dizzy. This can increase your chance of falling. Ask your doctor what other things that you can do to help prevent falls. This information is not intended to replace advice given to you by your health care provider. Make sure you discuss any questions you have with your health care provider. Document Released: 12/05/2008 Document Revised: 07/17/2015 Document Reviewed: 03/15/2014 Elsevier Interactive Patient Education  2017 [employer].

## 2022-04-29 NOTE — Progress Notes (Signed)
I connected with  Brandon Perez on 04/29/22 by a audio enabled telemedicine application and verified that I am speaking with the correct person using two identifiers.  Patient Location: Home  Provider Location: Office/Clinic  I discussed the limitations of evaluation and management by telemedicine. The patient expressed understanding and agreed to proceed.  Subjective:   Brandon Perez is a 67 y.o. male who presents for Medicare Annual/Subsequent preventive examination.  Review of Systems     Cardiac Risk Factors include: advanced age (>62mn, >>63women);hypertension;diabetes mellitus;male gender;obesity (BMI >30kg/m2)     Objective:    There were no vitals filed for this visit. There is no height or weight on file to calculate BMI.     04/29/2022    8:49 AM 11/28/2019    2:26 PM  Advanced Directives  Does Patient Have a Medical Advance Directive? No Yes  Type of ACorporate treasurerof AThorpLiving will  Copy of HLeetoniain Chart?  No - copy requested  Would patient like information on creating a medical advance directive? No - Patient declined     Current Medications (verified) Outpatient Encounter Medications as of 04/29/2022  Medication Sig   aspirin 81 MG tablet Take 81 mg by mouth daily.   atorvastatin (LIPITOR) 10 MG tablet Take 1 tablet (10 mg total) by mouth daily.   lisinopril (ZESTRIL) 10 MG tablet Take 1 tablet (10 mg total) by mouth daily.   metFORMIN (GLUCOPHAGE) 500 MG tablet Take 1 tablet (500 mg total) by mouth daily with breakfast.   No facility-administered encounter medications on file as of 04/29/2022.    Allergies (verified) Penicillin g   History: Past Medical History:  Diagnosis Date   Arthritis    Hypertension    Past Surgical History:  Procedure Laterality Date   COLONOSCOPY  02/23/2012   normal   Family History  Problem Relation Age of Onset   Heart disease Mother    Heart disease Father    Cancer  Sister    Hypertension Brother    Social History   Socioeconomic History   Marital status: Married    Spouse name: Not on file   Number of children: 2   Years of education: Not on file   Highest education level: Not on file  Occupational History   Not on file  Tobacco Use   Smoking status: Former    Packs/day: 1.00    Years: 30.00    Total pack years: 30.00    Types: Cigarettes    Quit date: 05/24/2019    Years since quitting: 2.9   Smokeless tobacco: Never  Vaping Use   Vaping Use: Never used  Substance and Sexual Activity   Alcohol use: No    Alcohol/week: 0.0 standard drinks of alcohol   Drug use: No   Sexual activity: Yes  Other Topics Concern   Not on file  Social History Narrative   Not on file   Social Determinants of Health   Financial Resource Strain: Low Risk  (04/29/2022)   Overall Financial Resource Strain (CARDIA)    Difficulty of Paying Living Expenses: Not hard at all  Food Insecurity: No Food Insecurity (04/29/2022)   Hunger Vital Sign    Worried About Running Out of Food in the Last Year: Never true    Ran Out of Food in the Last Year: Never true  Transportation Needs: No Transportation Needs (04/29/2022)   PRAPARE - THydrologist(Medical):  No    Lack of Transportation (Non-Medical): No  Physical Activity: Insufficiently Active (04/29/2022)   Exercise Vital Sign    Days of Exercise per Week: 2 days    Minutes of Exercise per Session: 20 min  Stress: No Stress Concern Present (04/29/2022)   [place]    Feeling of Stress : Not at all  Social Connections: Moderately Isolated (04/29/2022)   Social Connection and Isolation Panel [NHANES]    Frequency of Communication with Friends and Family: Twice a week    Frequency of Social Gatherings with Friends and Family: Once a week    Attends Religious Services: Never    Marine scientist or Organizations: No     Attends Music therapist: Never    Marital Status: Married    Tobacco Counseling Counseling given: Not Answered   Clinical Intake:  Pre-visit preparation completed: Yes  Pain : No/denies pain     Nutritional Risks: None Diabetes: Yes CBG done?: No Did pt. bring in CBG monitor from home?: No  How often do you need to have someone help you when you read instructions, pamphlets, or other written materials from your doctor or pharmacy?: 1 - Never  Diabetic?yes Nutrition Risk Assessment:  Has the patient had any N/V/D within the last 2 months?  No  Does the patient have any non-healing wounds?  No  Has the patient had any unintentional weight loss or weight gain?  No   Diabetes:  Is the patient diabetic?  Yes  If diabetic, was a CBG obtained today?  No  Did the patient bring in their glucometer from home?  No  How often do you monitor your CBG's? never.   Financial Strains and Diabetes Management:  Are you having any financial strains with the device, your supplies or your medication? No .  Does the patient want to be seen by Chronic Care Management for management of their diabetes?  No  Would the patient like to be referred to a Nutritionist or for Diabetic Management?  No   Diabetic Exams:  Diabetic Eye Exam: Completed last year per pt.- has had cataract surgery w/ lens implants. Pt has been advised about the importance in completing this exam. Diabetic Foot Exam: Completed no. Pt has been advised about the importance in completing this exam.   Interpreter Needed?: No  Information entered by :: Kirke Shaggy, LPN   Activities of Daily Living    04/29/2022    8:49 AM 05/14/2021    8:32 AM  In your present state of health, do you have any difficulty performing the following activities:  Hearing? 0 0  Vision? 0 0  Difficulty concentrating or making decisions? 0 0  Walking or climbing stairs? 0 0  Dressing or bathing? 0 0  Doing errands, shopping? 0 0   Preparing Food and eating ? N   Using the Toilet? N   In the past six months, have you accidently leaked urine? N   Do you have problems with loss of bowel control? N   Managing your Medications? N   Managing your Finances? N   Housekeeping or managing your Housekeeping? N     Patient Care Team: Juline Patch, MD as PCP - General (Family Medicine)  Indicate any recent Medical Services you may have received from other than [facility] providers in the past year (date may be approximate).     Assessment:   This is a routine  wellness examination for Elenore Rota.  Hearing/Vision screen Hearing Screening - Comments:: No aids Vision Screening - Comments:: No glasses- Dr.Kim in [place]  Dietary issues and exercise activities discussed: Current Exercise Habits: Home exercise routine, Type of exercise: walking, Time (Minutes): 20, Frequency (Times/Week): 2, Weekly Exercise (Minutes/Week): 40, Intensity: Mild   Goals Addressed             This Visit's Progress    DIET - EAT MORE FRUITS AND VEGETABLES         Depression Screen    04/29/2022    8:47 AM 04/16/2022    9:52 AM 12/14/2021    9:09 AM 08/19/2021    8:36 AM 06/12/2021    9:40 AM 05/14/2021    8:32 AM 05/12/2021    1:22 PM  PHQ 2/9 Scores  PHQ - 2 Score 0 0 0 0 0 0 0  PHQ- 9 Score 0 0 0 0 0 0 0    Fall Risk    04/29/2022    8:49 AM 04/16/2022    9:52 AM 12/14/2021    9:09 AM 08/19/2021    8:36 AM 06/12/2021    9:40 AM  Fall Risk   Falls in the past year? 0 0 0 0 0  Number falls in past yr: 0 0 0  0  Injury with Fall? 0 0 0 0 0  Risk for fall due to : No Fall Risks No Fall Risks No Fall Risks  No Fall Risks  Follow up Falls prevention discussed;Falls evaluation completed Falls evaluation completed Falls evaluation completed  Falls evaluation completed    FALL RISK PREVENTION PERTAINING TO THE HOME:  Any stairs in or around the home? No  If so, are there any without handrails? No  Home free of loose throw rugs in walkways,  pet beds, electrical cords, etc? Yes  Adequate lighting in your home to reduce risk of falls? Yes   ASSISTIVE DEVICES UTILIZED TO PREVENT FALLS:  Life alert? No  Use of a cane, walker or w/c? No  Grab bars in the bathroom? No  Shower chair or bench in shower? No  Elevated toilet seat or a handicapped toilet? Yes    Cognitive Function:        04/29/2022    8:55 AM 11/28/2019    2:28 PM  6CIT Screen  What Year? 0 points 0 points  What month? 0 points 0 points  What time? 0 points 0 points  Count back from 20 0 points 0 points  Months in reverse 0 points 0 points  Repeat phrase 0 points 2 points  Total Score 0 points 2 points    Immunizations Immunization History  Administered Date(s) Administered   Fluad Quad(high Dose 65+) 03/14/2019, 12/02/2020, 12/14/2021   Moderna SARS-COV2 Booster Vaccination 12/25/2019, 06/17/2020   Moderna Sars-Covid-2 Vaccination 04/09/2019, 05/04/2019    TDAP status: Due, Education has been provided regarding the importance of this vaccine. Advised may receive this vaccine at local pharmacy or Health Dept. Aware to provide a copy of the vaccination record if obtained from local pharmacy or Health Dept. Verbalized acceptance and understanding.  Flu Vaccine status: Up to date  Pneumococcal vaccine status: Declined,  Education has been provided regarding the importance of this vaccine but patient still declined. Advised may receive this vaccine at local pharmacy or Health Dept. Aware to provide a copy of the vaccination record if obtained from local pharmacy or Health Dept. Verbalized acceptance and understanding.   Covid-19 vaccine status: Completed  vaccines  Qualifies for Shingles Vaccine? Yes   Zostavax completed No   Shingrix Completed?: Yes- had done at [facility] per pt  Screening Tests Health Maintenance  Topic Date Due   COVID-19 Vaccine (5 - 2023-24 season) 05/02/2022 (Originally 10/23/2021)   Pneumonia Vaccine 72+ Years old (1 of 1 - PCV)  05/13/2022 (Originally 04/24/2018)   Zoster Vaccines- Shingrix (1 of 2) 07/15/2022 (Originally 04/24/2003)   Lung Cancer Screening  04/17/2023 (Originally 04/24/2003)   COLONOSCOPY (Pts 45-57yr Insurance coverage will need to be confirmed)  04/17/2023 (Originally 02/22/2022)   Diabetic kidney evaluation - eGFR measurement  12/15/2022   Diabetic kidney evaluation - Urine ACR  12/15/2022   Medicare Annual Wellness (AWV)  04/29/2023   INFLUENZA VACCINE  Completed   HPV VACCINES  Aged Out   DTaP/Tdap/Td  Discontinued   Hepatitis C Screening  Discontinued    Health Maintenance  There are no preventive care reminders to display for this patient.  Declined referral for colonoscopy  Lung Cancer Screening: (Low Dose CT Chest recommended if Age 69-80years, 30 pack-year currently smoking OR have quit w/in 15years.) does qualify.   Lung Cancer Screening Referral: declined referral  Additional Screening:  Hepatitis C Screening: does qualify; Completed no  Vision Screening: Recommended annual ophthalmology exams for early detection of glaucoma and other disorders of the eye. Is the patient up to date with their annual eye exam?  Yes  Who is the provider or what is the name of the office in which the patient attends annual eye exams[place] a provider, would they like to be referred to a provider to establish care? No .   Dental Screening: Recommended annual dental exams for proper oral hygiene  Community Resource Referral / Chronic Care Management: CRR required this visit?  No   CCM required this visit?  No      Plan:     I have personally reviewed and noted the following in the patient's chart:   Medical and social history Use of alcohol, tobacco or illicit drugs  Current medications and supplements including opioid prescriptions. Patient is not currently taking opioid prescriptions. Functional ability and status Nutritional status Physical  activity Advanced directives List of other physicians Hospitalizations, surgeries, and ER visits in previous 12 months Vitals Screenings to include cognitive, depression, and falls Referrals and appointments  In addition, I have reviewed and discussed with patient certain preventive protocols, quality metrics, and best practice recommendations. A written personalized care plan for preventive services as well as general preventive health recommendations were provided to patient.     LDionisio David LPN   [id number]  Nurse Notes: none

## 2022-06-22 ENCOUNTER — Ambulatory Visit: Payer: PPO | Admitting: Family Medicine

## 2022-06-23 ENCOUNTER — Other Ambulatory Visit (INDEPENDENT_AMBULATORY_CARE_PROVIDER_SITE_OTHER): Payer: PPO | Admitting: Radiology

## 2022-06-23 ENCOUNTER — Ambulatory Visit (INDEPENDENT_AMBULATORY_CARE_PROVIDER_SITE_OTHER): Payer: PPO | Admitting: Family Medicine

## 2022-06-23 ENCOUNTER — Encounter: Payer: Self-pay | Admitting: Family Medicine

## 2022-06-23 VITALS — BP 120/70 | HR 77 | Ht 68.0 in | Wt 271.0 lb

## 2022-06-23 DIAGNOSIS — M1711 Unilateral primary osteoarthritis, right knee: Secondary | ICD-10-CM | POA: Diagnosis not present

## 2022-06-23 DIAGNOSIS — M1712 Unilateral primary osteoarthritis, left knee: Secondary | ICD-10-CM

## 2022-06-23 MED ORDER — TRIAMCINOLONE ACETONIDE 40 MG/ML IJ SUSP
40.0000 mg | Freq: Once | INTRAMUSCULAR | Status: AC
  Administered 2022-06-23: 40 mg via INTRAMUSCULAR

## 2022-06-23 MED ORDER — TRAMADOL HCL 50 MG PO TABS: 50.0000 mg | ORAL_TABLET | Freq: Three times a day (TID) | ORAL | 0 refills | Status: DC | PRN

## 2022-06-23 NOTE — Assessment & Plan Note (Signed)
Acute on chronic in the setting of underlying osteoarthritis, of note did have good response from prior cortisone injection during the 01/12/2022 timeframe.  Has noted gradual recurrence, has upcoming trip.  Plan as follows: - Patient has elected to proceed with intra-articular corticosteroid injection - We will reach out to insurance to see If viscosupplementation and Zilretta - Interim as needed tramadol prescribed

## 2022-06-23 NOTE — Assessment & Plan Note (Signed)
Acute on chronic symptomatology in the setting of underlying osteoarthritis, has noted progressive pain, has upcoming trip.  Plan as follows: - Patient has elected to proceed with intra-articular corticosteroid injection - We will reach out to insurance to see If viscosupplementation and Zilretta - Interim as needed tramadol prescribed

## 2022-06-23 NOTE — Patient Instructions (Addendum)
You have just been given a cortisone injection to reduce pain and inflammation. After the injection you may notice immediate relief of pain as a result of the Lidocaine. It is important to rest the area of the injection for 24 to 48 hours after the injection. There is a possibility of some temporary increased discomfort and swelling for up to 72 hours until the cortisone begins to work. If you do have pain, simply rest the joint and use ice. If you can tolerate over the counter medications, you can try Tylenol, Aleve, or Advil for added relief per package instructions. - As above, relative rest x 2 days then gradual return normal activity - Can use tramadol for breakthrough pain respond to the above until symptoms respond to cortisone - We will reach out once we hear back from insurance regarding viscosupplementation (gel injections) and Zilretta (long-acting cortisone)

## 2022-06-23 NOTE — Progress Notes (Signed)
Primary Care / Sports Medicine Office Visit  Patient Information:  Patient ID: Brandon Perez, male DOB: 08/11/1955 Age: 67 y.o. MRN: [id number]   Brandon Perez is a pleasant 67 y.o. male presenting with the following:  Chief Complaint  Patient presents with   Primary osteoarthritis of left knee   Primary osteoarthritis of right knee    Vitals:   06/23/22 1105  BP: 120/70  Pulse: 77  SpO2: 95%   Vitals:   06/23/22 1105  Weight: 271 lb (122.9 kg)  Height: 5\' 8"  (1.727 m)   Body mass index is 41.21 kg/m.  No results found.   Independent interpretation of notes and tests performed by another provider:   None  Procedures performed:   Procedure:  Injection of right knee under ultrasound guidance. Ultrasound guidance out of plane right anteromedial approach, no effusion Samsung HS60 device utilized with permanent recording / reporting. Verbal informed consent obtained and verified. Skin prepped in a sterile fashion. Ethyl chloride for topical local analgesia.  Completed without difficulty and tolerated well. Medication: triamcinolone acetonide 40 mg/mL suspension for injection 1 mL total and 2 mL lidocaine 1% without epinephrine utilized for needle placement anesthetic Advised to contact for fevers/chills, erythema, induration, drainage, or persistent bleeding.  Procedure:  Injection of left knee under ultrasound guidance. Ultrasound guidance utilized for left anterolateral approach, no effusion noted Samsung HS60 device utilized with permanent recording / reporting. Verbal informed consent obtained and verified. Skin prepped in a sterile fashion. Ethyl chloride for topical local analgesia.  Completed without difficulty and tolerated well. Medication: triamcinolone acetonide 40 mg/mL suspension for injection 1 mL total and 2 mL lidocaine 1% without epinephrine utilized for needle placement anesthetic Advised to contact for fevers/chills, erythema, induration, drainage,  or persistent bleeding.   Pertinent History, Exam, Impression, and Recommendations:   Brandon Perez was seen today for primary osteoarthritis of left knee and primary osteoarthritis of right knee.  Primary osteoarthritis of right knee Overview: Cortisone injections 11/09/2021 06/23/2022  Assessment & Plan: Acute on chronic symptomatology in the setting of underlying osteoarthritis, has noted progressive pain, has upcoming trip.  Plan as follows: - Patient has elected to proceed with intra-articular corticosteroid injection - We will reach out to insurance to see If viscosupplementation and Zilretta - Interim as needed tramadol prescribed  Orders: -     Triamcinolone Acetonide -     [place] LIMITED JOINT SPACE STRUCTURES LOW BILAT; Future  Primary osteoarthritis of left knee Overview: Cortisone injections: - 01/12/2022 - 06/23/2022  Assessment & Plan: Acute on chronic in the setting of underlying osteoarthritis, of note did have good response from prior cortisone injection during the 01/12/2022 timeframe.  Has noted gradual recurrence, has upcoming trip.  Plan as follows: - Patient has elected to proceed with intra-articular corticosteroid injection - We will reach out to insurance to see If viscosupplementation and Zilretta - Interim as needed tramadol prescribed  Orders: -     Triamcinolone Acetonide -     [place] LIMITED JOINT SPACE STRUCTURES LOW BILAT; Future  Other orders -     traMADol HCl; Take 1 tablet (50 mg total) by mouth every 8 (eight) hours as needed for up to 5 days.  Dispense: 15 tablet; Refill: 0     Orders & Medications Meds ordered this encounter  Medications   triamcinolone acetonide (KENALOG-40) injection 40 mg   traMADol (ULTRAM) 50 MG tablet    Sig: Take 1 tablet (50 mg total) by mouth every 8 (eight) hours  as needed for up to 5 days.    Dispense:  15 tablet    Refill:  0   Orders Placed This Encounter  Procedures   [place] LIMITED JOINT SPACE STRUCTURES LOW  BILAT     Return if symptoms worsen or fail to improve.     Jerrol Banana, MD, [facility]   Primary Care Sports Medicine Primary Care and Sports Medicine at [facility]

## 2022-09-21 ENCOUNTER — Ambulatory Visit: Payer: Self-pay

## 2022-09-21 NOTE — Telephone Encounter (Signed)
Message from De Blanch sent at 09/21/2022  8:05 AM EDT  Summary: COVID Positive   Pt stated tested positive for COVID yesterday. Symptoms: chills, headache, congestion, and sore throat.   Seeking clinical advice.         Chief Complaint: sore throat Symptoms: chest congestion, cough, chills, headache Frequency: Sat sx started  Pertinent Negatives: Patient denies SOB, vomiting, fever Disposition: [] ED /[] Urgent Care (no appt availability in office) / [] Appointment(In office/virtual)/ []  [facility]/ [x] Home Care/ [] Refused Recommended Disposition /[] [facility]/ []  Follow-up with PCP Additional Notes: -  Reason for Disposition  [1] COVID-19 diagnosed by positive lab test (e.g., PCR, rapid self-test kit) AND [2] mild symptoms (e.g., cough, fever, others) AND [3] no complications or SOB  Answer Assessment - Initial Assessment Questions 1. COVID-19 DIAGNOSIS: "How do you know that you have COVID?" (e.g., positive lab test or self-test, diagnosed by doctor or NP/PA, symptoms after exposure).     Self test  2. COVID-19 EXPOSURE: "Was there any known exposure to COVID before the symptoms began?" CDC Definition of close contact: within 6 feet (2 meters) for a total of 15 minutes or more over a 24-hour period.      granddaughter 3. ONSET: "When did the COVID-19 symptoms start?"      Sat am  4. WORST SYMPTOM: "What is your worst symptom?" (e.g., cough, fever, shortness of breath, muscle aches)     Sore throat  5. COUGH: "Do you have a cough?" If Yes, ask: "How bad is the cough?"       Yes- congested cough phlegm white 6. FEVER: "Do you have a fever?" If Yes, ask: "What is your temperature, how was it measured, and when did it start?"     No fever 7. RESPIRATORY STATUS: "Describe your breathing?" (e.g., normal; shortness of breath, wheezing, unable to speak)      no 8. BETTER-SAME-WORSE: "Are you getting better, staying the same or getting worse compared to yesterday?"   If getting worse, ask, "In what way?"     Better  9. OTHER SYMPTOMS: "Do you have any other symptoms?"  (e.g., chills, fatigue, headache, loss of smell or taste, muscle pain, sore throat)     Chest congestion, chills, headache  10. HIGH RISK DISEASE: "Do you have any chronic medical problems?" (e.g., asthma, heart or lung disease, weak immune system, obesity, etc.)       no  Protocols used: Coronavirus (COVID-19) Diagnosed or Suspected-A-AH

## 2022-10-12 ENCOUNTER — Telehealth: Payer: Self-pay | Admitting: Family Medicine

## 2022-10-12 NOTE — Telephone Encounter (Signed)
Appointment made

## 2022-10-12 NOTE — Telephone Encounter (Signed)
Copied from CRM [id number]. Topic: General - Other >> Oct 12, 2022  2:45 PM Phill Myron wrote: Hardie Pulley  Please call Mr Rauner regarding the injection

## 2022-10-14 ENCOUNTER — Ambulatory Visit (INDEPENDENT_AMBULATORY_CARE_PROVIDER_SITE_OTHER): Payer: PPO | Admitting: Family Medicine

## 2022-10-14 ENCOUNTER — Other Ambulatory Visit (INDEPENDENT_AMBULATORY_CARE_PROVIDER_SITE_OTHER): Payer: PPO | Admitting: Radiology

## 2022-10-14 ENCOUNTER — Encounter: Payer: Self-pay | Admitting: Family Medicine

## 2022-10-14 VITALS — BP 120/80 | HR 77 | Ht 68.0 in | Wt 271.0 lb

## 2022-10-14 DIAGNOSIS — M1712 Unilateral primary osteoarthritis, left knee: Secondary | ICD-10-CM | POA: Diagnosis not present

## 2022-10-14 DIAGNOSIS — M1711 Unilateral primary osteoarthritis, right knee: Secondary | ICD-10-CM

## 2022-10-14 MED ORDER — HYALURONAN 88 MG/4ML IX SOSY
88.0000 mg | PREFILLED_SYRINGE | Freq: Once | INTRA_ARTICULAR | Status: AC
  Administered 2022-10-14: 88 mg via INTRA_ARTICULAR

## 2022-10-14 MED ORDER — TRIAMCINOLONE ACETONIDE 40 MG/ML IJ SUSP
40.0000 mg | Freq: Once | INTRAMUSCULAR | Status: AC
  Administered 2022-10-21: 40 mg via INTRAMUSCULAR

## 2022-10-14 NOTE — Assessment & Plan Note (Addendum)
Patient brings up worsening knee pain, had cortisone injection 3-4 months prior with recent worsening. Contralateral knee still doing well from prior injection at same time.  Plan: - CSI today - Viscosupplementation today

## 2022-10-14 NOTE — Patient Instructions (Signed)
You have just been given a cortisone injection to reduce pain and inflammation. After the injection you may notice immediate relief of pain as a result of the Lidocaine. It is important to rest the area of the injection for 24 to 48 hours after the injection. There is a possibility of some temporary increased discomfort and swelling for up to 72 hours until the cortisone begins to work. If you do have pain, simply rest the joint and use ice. If you can tolerate over the counter medications, you can try Tylenol, Aleve, or Advil for added relief per package instructions. - Relative rest x 2 days then gradually return to normal activity - Return next week for right knee

## 2022-10-14 NOTE — Progress Notes (Signed)
     Primary Care / Sports Medicine Office Visit  Patient Information:  Patient ID: Brandon Perez, male DOB: March 31, 1955 Age: 67 y.o. MRN: [id number]   Brandon Perez is a pleasant 67 y.o. male presenting with the following:  No chief complaint on file.   Vitals:   10/14/22 0951  BP: 120/80  Pulse: 77  SpO2: 98%   Vitals:   10/14/22 0951  Weight: 271 lb (122.9 kg)  Height: 5\' 8"  (1.727 m)   Body mass index is 41.21 kg/m.  No results found.   Independent interpretation of notes and tests performed by another provider:   None  Procedures performed:   Procedure:  Injection of left knee under ultrasound guidance. Ultrasound guidance utilized for anterolateral approach, joint visualized Samsung HS60 device utilized with permanent recording / reporting. Verbal informed consent obtained and verified. Skin prepped in a sterile fashion. Ethyl chloride for topical local analgesia.  Completed without difficulty and tolerated well. Medication: Monovisc 88 mg (4 mL), triamcinolone acetonide 40 mg/mL suspension for injection 1 mL total and 2 mL lidocaine 1% without epinephrine utilized for needle placement anesthetic Advised to contact for fevers/chills, erythema, induration, drainage, or persistent bleeding.   Pertinent History, Exam, Impression, and Recommendations:   Primary osteoarthritis of left knee Overview: Cortisone injections: - 01/12/2022 - 06/23/2022 - 10/14/2022  Viscosupplementation: - 10/14/2022  Assessment & Plan: Patient brings up worsening knee pain, had cortisone injection 3-4 months prior with recent worsening. Contralateral knee still doing well from prior injection at same time.  Plan: - CSI today - Viscosupplementation today  Orders: -     [place] LIMITED JOINT SPACE STRUCTURES LOW LEFT; Future -     Hyaluronan -     Triamcinolone Acetonide  Primary osteoarthritis of right knee Overview: Cortisone injections 11/09/2021 06/23/2022  Assessment &  Plan: Approved for viscosupplementation, knee still doing well following recent cortisone injection 5/1.  - Will return next week for planned viscosupplementation injection      Orders & Medications Meds ordered this encounter  Medications   Hyaluronan (MONOVISC) intra-articular injection 88 mg   triamcinolone acetonide (KENALOG-40) injection 40 mg   Orders Placed This Encounter  Procedures   [place] LIMITED JOINT SPACE STRUCTURES LOW LEFT     No follow-ups on file.     Jerrol Banana, MD, [facility]   Primary Care Sports Medicine Primary Care and Sports Medicine at [facility]

## 2022-10-14 NOTE — Assessment & Plan Note (Signed)
Approved for viscosupplementation, knee still doing well following recent cortisone injection 5/1.  - Will return next week for planned viscosupplementation injection

## 2022-10-15 ENCOUNTER — Encounter: Payer: Self-pay | Admitting: Family Medicine

## 2022-10-15 ENCOUNTER — Ambulatory Visit (INDEPENDENT_AMBULATORY_CARE_PROVIDER_SITE_OTHER): Payer: PPO | Admitting: Family Medicine

## 2022-10-15 VITALS — BP 110/64 | HR 74 | Ht 68.0 in | Wt 273.0 lb

## 2022-10-15 DIAGNOSIS — E782 Mixed hyperlipidemia: Secondary | ICD-10-CM | POA: Diagnosis not present

## 2022-10-15 DIAGNOSIS — I10 Essential (primary) hypertension: Secondary | ICD-10-CM | POA: Diagnosis not present

## 2022-10-15 DIAGNOSIS — Z7984 Long term (current) use of oral hypoglycemic drugs: Secondary | ICD-10-CM | POA: Diagnosis not present

## 2022-10-15 DIAGNOSIS — E119 Type 2 diabetes mellitus without complications: Secondary | ICD-10-CM

## 2022-10-15 MED ORDER — METFORMIN HCL 500 MG PO TABS
500.0000 mg | ORAL_TABLET | Freq: Every day | ORAL | 1 refills | Status: DC
Start: 2022-10-15 — End: 2023-04-04

## 2022-10-15 MED ORDER — LISINOPRIL 10 MG PO TABS
10.0000 mg | ORAL_TABLET | Freq: Every day | ORAL | 1 refills | Status: DC
Start: 2022-10-15 — End: 2023-04-04

## 2022-10-15 MED ORDER — ATORVASTATIN CALCIUM 10 MG PO TABS
10.0000 mg | ORAL_TABLET | Freq: Every day | ORAL | 1 refills | Status: DC
Start: 2022-10-15 — End: 2023-04-04

## 2022-10-15 NOTE — Progress Notes (Signed)
Date:  10/15/2022   Name:  Brandon Perez   DOB:  07-Sep-1955   MRN:  [id number]   Chief Complaint: Diabetes, Hyperlipidemia, and Hypertension  Diabetes He presents for his follow-up diabetic visit. He has type 2 diabetes mellitus. His disease course has been stable. There are no hypoglycemic associated symptoms. Pertinent negatives for hypoglycemia include no dizziness, headaches or nervousness/anxiousness. There are no diabetic associated symptoms. Pertinent negatives for diabetes include no blurred vision, no chest pain and no polydipsia. There are no hypoglycemic complications. Symptoms are stable. There are no diabetic complications. Pertinent negatives for diabetic complications include no CVA. There are no known risk factors for coronary artery disease. Current diabetic treatment includes oral agent (monotherapy). He is compliant with treatment all of the time. His weight is stable. He is following a generally healthy diet. Meal planning includes avoidance of concentrated sweets and carbohydrate counting. He participates in exercise intermittently. His home blood glucose trend is decreasing rapidly. An ACE inhibitor/angiotensin II receptor blocker is being taken.  Hyperlipidemia This is a chronic problem. The current episode started more than 1 year ago. The problem is controlled. Recent lipid tests were reviewed and are normal. Exacerbating diseases include diabetes. He has no history of chronic renal disease. Pertinent negatives include no chest pain, leg pain, myalgias or shortness of breath. Current antihyperlipidemic treatment includes statins. The current treatment provides moderate improvement of lipids. There are no compliance problems.  Risk factors for coronary artery disease include diabetes mellitus.  Hypertension This is a chronic problem. The current episode started more than 1 year ago. The problem has been gradually improving since onset. The problem is controlled. Pertinent  negatives include no blurred vision, chest pain, headaches, neck pain, orthopnea, palpitations, PND or shortness of breath. There are no associated agents to hypertension. There are no known risk factors for coronary artery disease. Past treatments include ACE inhibitors. The current treatment provides moderate improvement. There are no compliance problems.  There is no history of CAD/MI or CVA. There is no history of chronic renal disease, a hypertension causing med or renovascular disease.    Lab Results  Component Value Date   NA 139 12/14/2021   K 4.5 12/14/2021   CO2 21 12/14/2021   GLUCOSE 91 12/14/2021   BUN 17 12/14/2021   CREATININE 1.15 12/14/2021   CALCIUM 9.5 12/14/2021   EGFR 69 12/14/2021   GFRNONAA 69 03/14/2019   Lab Results  Component Value Date   CHOL 165 04/16/2022   HDL 34 (L) 04/16/2022   LDLCALC 90 04/16/2022   TRIG 245 (H) 04/16/2022   No results found for: "TSH" Lab Results  Component Value Date   HGBA1C 7.0 (H) 04/16/2022   No results found for: "WBC", "HGB", "HCT", "MCV", "PLT" Lab Results  Component Value Date   ALT 19 12/14/2021   AST 18 12/14/2021   ALKPHOS 83 12/14/2021   BILITOT 0.2 12/14/2021   No results found for: "25OHVITD2", "25OHVITD3", "VD25OH"   Review of Systems  Constitutional:  Negative for chills and fever.  HENT:  Negative for drooling, ear discharge, ear pain and sore throat.   Eyes:  Negative for blurred vision.  Respiratory:  Negative for cough, shortness of breath and wheezing.   Cardiovascular:  Negative for chest pain, palpitations, orthopnea, leg swelling and PND.  Gastrointestinal:  Negative for abdominal pain, blood in stool, constipation, diarrhea and nausea.  Endocrine: Negative for polydipsia.  Genitourinary:  Negative for dysuria, frequency, hematuria and urgency.  Musculoskeletal:  Negative for back pain, myalgias and neck pain.  Skin:  Negative for rash.  Allergic/Immunologic: Negative for environmental  allergies.  Neurological:  Negative for dizziness and headaches.  Hematological:  Does not bruise/bleed easily.  Psychiatric/Behavioral:  Negative for suicidal ideas. The patient is not nervous/anxious.     Patient Active Problem List   Diagnosis Date Noted   Achilles tendon tear, left, initial encounter 11/09/2021   Primary osteoarthritis of left knee 05/14/2021   Primary osteoarthritis of right knee 05/14/2021   Calcific Achilles tendinitis of left lower extremity 05/14/2021    Allergies  Allergen Reactions   Penicillin G Other (See Comments)    As a child    Past Surgical History:  Procedure Laterality Date   COLONOSCOPY  02/23/2012   normal    Social History   Tobacco Use   Smoking status: Former    Current packs/day: 0.00    Average packs/day: 1 pack/day for 30.0 years (30.0 ttl pk-yrs)    Types: Cigarettes    Start date: 05/23/1989    Quit date: 05/24/2019    Years since quitting: 3.3   Smokeless tobacco: Never  Vaping Use   Vaping status: Never Used  Substance Use Topics   Alcohol use: No    Alcohol/week: 0.0 standard drinks of alcohol   Drug use: No     Medication list has been reviewed and updated.  Current Meds  Medication Sig   aspirin 81 MG tablet Take 81 mg by mouth daily.   atorvastatin (LIPITOR) 10 MG tablet Take 1 tablet (10 mg total) by mouth daily.   lisinopril (ZESTRIL) 10 MG tablet Take 1 tablet (10 mg total) by mouth daily.   metFORMIN (GLUCOPHAGE) 500 MG tablet Take 1 tablet (500 mg total) by mouth daily with breakfast.   Current Facility-Administered Medications for the 10/15/22 encounter (Office Visit) with Duanne Limerick, MD  Medication   triamcinolone acetonide (KENALOG-40) injection 40 mg       10/15/2022    9:28 AM 04/16/2022    9:52 AM 12/14/2021    9:09 AM 08/19/2021    8:36 AM  GAD 7 : Generalized Anxiety Score  Nervous, Anxious, on Edge 0 0 0 0  Control/stop worrying 0 0 0 0  Worry too much - different things 0 0 0 0   Trouble relaxing 0 0 0 0  Restless 0 0 0 0  Easily annoyed or irritable 0 0 0 0  Afraid - awful might happen 0 0 0 0  Total GAD 7 Score 0 0 0 0  Anxiety Difficulty Not difficult at all Not difficult at all Not difficult at all Not difficult at all       10/15/2022    9:28 AM 04/29/2022    8:47 AM 04/16/2022    9:52 AM  Depression screen PHQ 2/9  Decreased Interest 0 0 0  Down, Depressed, Hopeless 0 0 0  PHQ - 2 Score 0 0 0  Altered sleeping 0 0 0  Tired, decreased energy 0 0 0  Change in appetite 0 0 0  Feeling bad or failure about yourself  0 0 0  Trouble concentrating 0 0 0  Moving slowly or fidgety/restless 0 0 0  Suicidal thoughts 0 0 0  PHQ-9 Score 0 0 0  Difficult doing work/chores Not difficult at all Not difficult at all Not difficult at all    BP Readings from Last 3 Encounters:  10/15/22 110/64  10/14/22 120/80  06/23/22 120/70  Physical Exam Vitals and nursing note reviewed.  HENT:     Head: Normocephalic.     Right Ear: Tympanic membrane and external ear normal.     Left Ear: Tympanic membrane and external ear normal.     Nose: Nose normal. No congestion or rhinorrhea.     Mouth/Throat:     Mouth: Mucous membranes are moist.  Eyes:     General: No scleral icterus.       Right eye: No discharge.        Left eye: No discharge.     Conjunctiva/sclera: Conjunctivae normal.     Pupils: Pupils are equal, round, and reactive to light.  Neck:     Thyroid: No thyromegaly.     Vascular: No JVD.     Trachea: No tracheal deviation.  Cardiovascular:     Rate and Rhythm: Normal rate and regular rhythm.     Heart sounds: Normal heart sounds. No murmur heard.    No friction rub. No gallop.  Pulmonary:     Effort: No respiratory distress.     Breath sounds: Normal breath sounds. No wheezing, rhonchi or rales.  Abdominal:     General: Bowel sounds are normal.     Palpations: Abdomen is soft. There is no mass.     Tenderness: There is no abdominal tenderness.  There is no guarding or rebound.  Musculoskeletal:        General: No tenderness. Normal range of motion.     Cervical back: Normal range of motion and neck supple.  Lymphadenopathy:     Cervical: No cervical adenopathy.  Skin:    General: Skin is warm.     Findings: No rash.  Neurological:     Mental Status: He is alert and oriented to person, place, and time.     Cranial Nerves: No cranial nerve deficit.     Deep Tendon Reflexes: Reflexes are normal and symmetric.     Wt Readings from Last 3 Encounters:  10/15/22 273 lb (123.8 kg)  10/14/22 271 lb (122.9 kg)  06/23/22 271 lb (122.9 kg)    BP 110/64   Pulse 74   Ht 5\' 8"  (1.727 m)   Wt 273 lb (123.8 kg)   SpO2 97%   BMI 41.51 kg/m   Assessment and Plan:  1. Diabetes mellitus treated with oral medication (HCC) Chronic.  Controlled.  Stable.  Continue metformin 500 mg once a day.  Will check A1c and microalbuminuria.  Will also check CMP for electrolytes and GFR. - metFORMIN (GLUCOPHAGE) 500 MG tablet; Take 1 tablet (500 mg total) by mouth daily with breakfast.  Dispense: 90 tablet; Refill: 1 - HgB A1c - Microalbumin / creatinine urine ratio - Comprehensive Metabolic Panel (CMET)  2. Mixed hyperlipidemia .  Controlled.  Stable.  Continue atorvastatin 10 mg once a day.  Will check lipid panel for current status of LDL control. - atorvastatin (LIPITOR) 10 MG tablet; Take 1 tablet (10 mg total) by mouth daily.  Dispense: 90 tablet; Refill: 1 - Lipid Panel With LDL/HDL Ratio  3. Essential hypertension Chronic.  Controlled.  Stable.  Blood pressure 110/64.  Continue lisinopril 10 mg once a day.  Will check CMP for electrolytes and GFR. - lisinopril (ZESTRIL) 10 MG tablet; Take 1 tablet (10 mg total) by mouth daily.  Dispense: 90 tablet; Refill: 1 - Comprehensive Metabolic Panel (CMET)    Elizabeth Sauer, MD

## 2022-10-18 DIAGNOSIS — E782 Mixed hyperlipidemia: Secondary | ICD-10-CM | POA: Diagnosis not present

## 2022-10-18 DIAGNOSIS — Z7984 Long term (current) use of oral hypoglycemic drugs: Secondary | ICD-10-CM | POA: Diagnosis not present

## 2022-10-18 DIAGNOSIS — I10 Essential (primary) hypertension: Secondary | ICD-10-CM | POA: Diagnosis not present

## 2022-10-18 DIAGNOSIS — E119 Type 2 diabetes mellitus without complications: Secondary | ICD-10-CM | POA: Diagnosis not present

## 2022-10-19 LAB — HEMOGLOBIN A1C
Est. average glucose Bld gHb Est-mCnc: 154 mg/dL
Hgb A1c MFr Bld: 7 % — ABNORMAL HIGH (ref 4.8–5.6)

## 2022-10-19 LAB — LIPID PANEL WITH LDL/HDL RATIO
Cholesterol, Total: 170 mg/dL (ref 100–199)
HDL: 44 mg/dL (ref >39)
LDL Chol Calc (NIH): 81 mg/dL (ref 0–99)
LDL/HDL Ratio: 1.8 ratio (ref 0.0–3.6)
Triglycerides: 273 mg/dL — ABNORMAL HIGH (ref 0–149)
VLDL Cholesterol Cal: 45 mg/dL — ABNORMAL HIGH (ref 5–40)

## 2022-10-19 LAB — COMPREHENSIVE METABOLIC PANEL
ALT: 15 IU/L (ref 0–44)
AST: 13 IU/L (ref 0–40)
Albumin: 4.1 g/dL (ref 3.9–4.9)
Alkaline Phosphatase: 73 IU/L (ref 44–121)
BUN/Creatinine Ratio: 18 (ref 10–24)
BUN: 20 mg/dL (ref 8–27)
Bilirubin Total: 0.4 mg/dL (ref 0.0–1.2)
CO2: 25 mmol/L (ref 20–29)
Calcium: 9.7 mg/dL (ref 8.6–10.2)
Chloride: 95 mmol/L — ABNORMAL LOW (ref 96–106)
Creatinine, Ser: 1.14 mg/dL (ref 0.76–1.27)
Globulin, Total: 2.7 g/dL (ref 1.5–4.5)
Glucose: 102 mg/dL — ABNORMAL HIGH (ref 70–99)
Potassium: 5 mmol/L (ref 3.5–5.2)
Sodium: 134 mmol/L (ref 134–144)
Total Protein: 6.8 g/dL (ref 6.0–8.5)
eGFR: 70 mL/min/{1.73_m2} (ref >59)

## 2022-10-19 LAB — MICROALBUMIN / CREATININE URINE RATIO
Creatinine, Urine: 55.2 mg/dL
Microalb/Creat Ratio: <5 mg/g{creat} (ref 0–29)
Microalbumin, Urine: <3 ug/mL

## 2022-10-21 ENCOUNTER — Ambulatory Visit (INDEPENDENT_AMBULATORY_CARE_PROVIDER_SITE_OTHER): Payer: PPO | Admitting: Family Medicine

## 2022-10-21 ENCOUNTER — Encounter: Payer: Self-pay | Admitting: Family Medicine

## 2022-10-21 ENCOUNTER — Other Ambulatory Visit (INDEPENDENT_AMBULATORY_CARE_PROVIDER_SITE_OTHER): Payer: PPO | Admitting: Radiology

## 2022-10-21 VITALS — BP 118/78 | HR 78 | Ht 68.0 in | Wt 273.0 lb

## 2022-10-21 DIAGNOSIS — M1711 Unilateral primary osteoarthritis, right knee: Secondary | ICD-10-CM | POA: Diagnosis not present

## 2022-10-21 DIAGNOSIS — M1712 Unilateral primary osteoarthritis, left knee: Secondary | ICD-10-CM

## 2022-10-21 MED ORDER — HYALURONAN 88 MG/4ML IX SOSY
88.0000 mg | PREFILLED_SYRINGE | Freq: Once | INTRA_ARTICULAR | Status: AC
Start: 2022-10-21 — End: 2022-10-21
  Administered 2022-10-21: 88 mg via INTRA_ARTICULAR

## 2022-10-22 NOTE — Patient Instructions (Signed)
You have just been given a gel lubricant injection to reduce knee pain. You can resume normal activities but avoid anything strenuous that puts excess strain on the joint for 48 hours. Avoid activities such as jogging, soccer, tennis, heavy lifting, or standing on your feet for a long time. If you do have pain, simply rest the joint and use ice. If you can tolerate over the counter medications, you can try Tylenol, Aleve, or Advil for added relief per package instructions. - The gel can be repeated in 6 months, contact our office to schedule or for any questions 

## 2022-10-22 NOTE — Progress Notes (Signed)
     Primary Care / Sports Medicine Office Visit  Patient Information:  Patient ID: Brandon Perez, male DOB: Jan 18, 1956 Age: 67 y.o. MRN: [id number]   Brandon Perez is a pleasant 68 y.o. male presenting with the following:  Chief Complaint  Patient presents with   Primary osteoarthritis of right knee    Vitals:   10/21/22 1432  BP: 118/78  Pulse: 78  SpO2: 98%   Vitals:   10/21/22 1432  Weight: 273 lb (123.8 kg)  Height: 5\' 8"  (1.727 m)   Body mass index is 41.51 kg/m.     Independent interpretation of notes and tests performed by another provider:   None  Procedures performed:   Procedure:  Injection of right knee under ultrasound guidance. Ultrasound guidance utilized for anteromedial approach Samsung HS60 device utilized with permanent recording / reporting. Verbal informed consent obtained and verified. Skin prepped in a sterile fashion. Ethyl chloride for topical local analgesia.  Completed without difficulty and tolerated well. Medication: Monovisc 88 mg (4 mL) Advised to contact for fevers/chills, erythema, induration, drainage, or persistent bleeding.   Pertinent History, Exam, Impression, and Recommendations:   Brandon Perez was seen today for primary osteoarthritis of right knee.  Primary osteoarthritis of right knee Overview: Cortisone injections 11/09/2021 06/23/2022  Viscosupplementation 10/21/2022  Assessment & Plan: Received HA injection to right knee today  Orders: -     [place] LIMITED JOINT SPACE STRUCTURES LOW RIGHT; Future -     Hyaluronan     Orders & Medications Meds ordered this encounter  Medications   Hyaluronan (MONOVISC) intra-articular injection 88 mg   Orders Placed This Encounter  Procedures   [place] LIMITED JOINT SPACE STRUCTURES LOW RIGHT     No follow-ups on file.     Jerrol Banana, MD, [facility]   Primary Care Sports Medicine Primary Care and Sports Medicine at [facility]

## 2022-10-22 NOTE — Assessment & Plan Note (Signed)
Received HA injection to right knee today

## 2022-11-12 ENCOUNTER — Ambulatory Visit (INDEPENDENT_AMBULATORY_CARE_PROVIDER_SITE_OTHER): Payer: PPO

## 2022-11-12 DIAGNOSIS — Z23 Encounter for immunization: Secondary | ICD-10-CM | POA: Diagnosis not present

## 2023-01-06 DIAGNOSIS — Z1212 Encounter for screening for malignant neoplasm of rectum: Secondary | ICD-10-CM | POA: Diagnosis not present

## 2023-01-10 DIAGNOSIS — Q142 Congenital malformation of optic disc: Secondary | ICD-10-CM | POA: Diagnosis not present

## 2023-01-10 DIAGNOSIS — Z961 Presence of intraocular lens: Secondary | ICD-10-CM | POA: Diagnosis not present

## 2023-01-10 DIAGNOSIS — D3131 Benign neoplasm of right choroid: Secondary | ICD-10-CM | POA: Diagnosis not present

## 2023-01-10 DIAGNOSIS — E119 Type 2 diabetes mellitus without complications: Secondary | ICD-10-CM | POA: Diagnosis not present

## 2023-01-10 LAB — HM DIABETES EYE EXAM

## 2023-01-28 ENCOUNTER — Encounter: Payer: Self-pay | Admitting: Family Medicine

## 2023-01-28 ENCOUNTER — Ambulatory Visit: Payer: PPO | Admitting: Family Medicine

## 2023-01-28 ENCOUNTER — Other Ambulatory Visit (INDEPENDENT_AMBULATORY_CARE_PROVIDER_SITE_OTHER): Payer: PPO | Admitting: Radiology

## 2023-01-28 VITALS — BP 112/70 | HR 67 | Ht 68.0 in | Wt 269.8 lb

## 2023-01-28 DIAGNOSIS — M1711 Unilateral primary osteoarthritis, right knee: Secondary | ICD-10-CM

## 2023-01-28 DIAGNOSIS — M1712 Unilateral primary osteoarthritis, left knee: Secondary | ICD-10-CM | POA: Diagnosis not present

## 2023-01-28 MED ORDER — TRIAMCINOLONE ACETONIDE 40 MG/ML IJ SUSP
40.0000 mg | Freq: Once | INTRAMUSCULAR | Status: AC
Start: 2023-01-28 — End: 2023-01-28
  Administered 2023-01-28: 40 mg via INTRA_ARTICULAR

## 2023-01-28 MED ORDER — TRAMADOL HCL 50 MG PO TABS: 50.0000 mg | ORAL_TABLET | Freq: Three times a day (TID) | ORAL | 0 refills | Status: DC | PRN

## 2023-01-28 NOTE — Progress Notes (Signed)
Primary Care / Sports Medicine Office Visit  Patient Information:  Patient ID: Brandon Perez, male DOB: Oct 22, 1955 Age: 67 y.o. MRN: [id number]   Brandon Perez is a pleasant 67 y.o. male presenting with the following:  Chief Complaint  Patient presents with   Knee Pain    Patient here for left knee pain. He had gel injections on 10/21/22, his left knee injection gave minimal relief and he is back in pain. It bothers him more when he is walking , weightbearing , and when he is lying down in bed. He is hoping to get an corticosteroid injection today.    Vitals:   01/28/23 1513  BP: 112/70  Pulse: 67  SpO2: 97%   Vitals:   01/28/23 1513  Weight: 269 lb 12.8 oz (122.4 kg)  Height: 5\' 8"  (1.727 m)   Body mass index is 41.02 kg/m.  No results found.   Independent interpretation of notes and tests performed by another provider:   None  Procedures performed:   Procedure:  Injection of left knee under ultrasound guidance. Ultrasound guidance utilized for anteromedial approach, no effusion noted Samsung HS60 device utilized with permanent recording / reporting. Verbal informed consent obtained and verified. Skin prepped in a sterile fashion. Ethyl chloride for topical local analgesia.  Completed without difficulty and tolerated well. Medication: triamcinolone acetonide 40 mg/mL suspension for injection 1 mL total and 2 mL lidocaine 1% without epinephrine utilized for needle placement anesthetic Advised to contact for fevers/chills, erythema, induration, drainage, or persistent bleeding.   Pertinent History, Exam, Impression, and Recommendations:   Problem List Items Addressed This Visit       Musculoskeletal and Integument   Primary osteoarthritis of left knee - Primary    History of Present Illness The patient, with a history of bilateral knee pain, presents for follow-up. The right knee has improved significantly following a cortisone injection back in 09/2022, however,  the left knee continues to cause discomfort. The patient reports that the left knee injection may have provided relief for a couple of weeks, but the pain has since returned. The patient denies any relief from diclofenac and meloxicam, and cannot recall relief from tramadol previously prescribed.  Left Knee Osteoarthritis Persistent pain despite previous cortisone and gel injections 09/2022. Discussed the potential benefit of an MRI to assess the extent of degenerative changes and associated potential surgical options. Also discussed the potential use of Cymbalta for pain management if the need arises. -Administer cortisone injection today. -Consider MRI if no improvement in pain within two weeks.  Patient to contact us at that time so that we can proceed accordingly. -Consider starting Cymbalta if no improvement in pain within two weeks. -Refill Tramadol prescription for pain management.  Advised dosing this concurrently with Tylenol arthritis (650 mg) and ibuprofen 600-800 mg      Relevant Medications   traMADol (ULTRAM) 50 MG tablet   Other Relevant Orders   [place] LIMITED JOINT SPACE STRUCTURES LOW LEFT   Primary osteoarthritis of right knee    Right Knee Osteoarthritis Significant improvement in pain following previous cortisone and gel injections. -Continue current management.      Relevant Medications   traMADol (ULTRAM) 50 MG tablet     Orders & Medications Medications:  Meds ordered this encounter  Medications   traMADol (ULTRAM) 50 MG tablet    Sig: Take 1 tablet (50 mg total) by mouth every 8 (eight) hours as needed for up to 5 days.    Dispense:  15 tablet    Refill:  0   Orders Placed This Encounter  Procedures   [place] LIMITED JOINT SPACE STRUCTURES LOW LEFT     No follow-ups on file.     Jerrol Banana, MD, [facility]   Primary Care Sports Medicine Primary Care and Sports Medicine at [facility]

## 2023-01-28 NOTE — Assessment & Plan Note (Signed)
Right Knee Osteoarthritis Significant improvement in pain following previous cortisone and gel injections. -Continue current management.

## 2023-01-28 NOTE — Assessment & Plan Note (Signed)
History of Present Illness The patient, with a history of bilateral knee pain, presents for follow-up. The right knee has improved significantly following a cortisone injection back in 09/2022, however, the left knee continues to cause discomfort. The patient reports that the left knee injection may have provided relief for a couple of weeks, but the pain has since returned. The patient denies any relief from diclofenac and meloxicam, and cannot recall relief from tramadol previously prescribed.  Left Knee Osteoarthritis Persistent pain despite previous cortisone and gel injections 09/2022. Discussed the potential benefit of an MRI to assess the extent of degenerative changes and associated potential surgical options. Also discussed the potential use of Cymbalta for pain management if the need arises. -Administer cortisone injection today. -Consider MRI if no improvement in pain within two weeks.  Patient to contact us at that time so that we can proceed accordingly. -Consider starting Cymbalta if no improvement in pain within two weeks. -Refill Tramadol prescription for pain management.  Advised dosing this concurrently with Tylenol arthritis (650 mg) and ibuprofen 600-800 mg

## 2023-01-28 NOTE — Patient Instructions (Signed)
-  LEFT KNEE OSTEOARTHRITIS:  -We administered a cortisone injection today to help with the pain. -If there is no improvement in your pain within two weeks, we will consider getting an MRI to assess the extent of the damage and discuss potential surgical options. -We may also consider starting you on Cymbalta for pain management. -Your Tramadol prescription has been refilled for pain relief.  Does this alongside Tylenol arthritis (650 mg) and ibuprofen 600 to 800 mg for maximum pain control.

## 2023-01-31 ENCOUNTER — Encounter: Payer: Self-pay | Admitting: Family Medicine

## 2023-03-03 ENCOUNTER — Ambulatory Visit (INDEPENDENT_AMBULATORY_CARE_PROVIDER_SITE_OTHER): Payer: PPO | Admitting: Family Medicine

## 2023-03-03 ENCOUNTER — Encounter: Payer: Self-pay | Admitting: Family Medicine

## 2023-03-03 VITALS — BP 120/84 | HR 100 | Ht 68.0 in | Wt 266.0 lb

## 2023-03-03 DIAGNOSIS — M1711 Unilateral primary osteoarthritis, right knee: Secondary | ICD-10-CM | POA: Diagnosis not present

## 2023-03-03 DIAGNOSIS — M1712 Unilateral primary osteoarthritis, left knee: Secondary | ICD-10-CM

## 2023-03-03 MED ORDER — TRAMADOL HCL 50 MG PO TABS: 50.0000 mg | ORAL_TABLET | Freq: Three times a day (TID) | ORAL | 0 refills | Status: AC | PRN

## 2023-03-03 MED ORDER — DULOXETINE HCL 30 MG PO CPEP: ORAL_CAPSULE | ORAL | 0 refills | Status: DC

## 2023-03-03 NOTE — Progress Notes (Signed)
     Primary Care / Sports Medicine Office Visit  Patient Information:  Patient ID: Brandon Perez, male DOB: [birth year] Age: 68 y.o. MRN: [id number]   Brandon Perez is a pleasant 68 y.o. male presenting with the following:  Chief Complaint  Patient presents with   Knee Pain    Chronic left knee pan. Cortisone injection on 01/28/23 helped temporarily for 2 weeks. He would like to discuss different options at this point.     Vitals:   03/03/23 1052  BP: 120/84  Pulse: 100  SpO2: 94%   Vitals:   03/03/23 1052  Weight: 266 lb (120.7 kg)  Height: 5' 8 (1.727 m)   Body mass index is 40.45 kg/m.  No results found.   Independent interpretation of notes and tests performed by another provider:   None  Procedures performed:   None  Pertinent History, Exam, Impression, and Recommendations:   Problem List Items Addressed This Visit     Primary osteoarthritis of left knee - Primary   History of Present Illness Brandon Perez, a patient with chronic left knee pain, reports that the relief from cortisone injections (left knee injection 01/28/2023) has been short-lived, lasting only about two weeks. The pain has been persistent and has not responded well to multiple cortisone injections. The patient has been managing the pain with over-the-counter arthritis pills and tramadol , which he takes at night to help him sleep. He also mentions a past injury to his ipsilateral Achilles tendon, which may have affected his gait and potentially contributed to his knee pain.  Assessment and Plan Left knee Osteoarthritis-chronic Persistent pain despite cortisone injections. Discussed the use of Cymbalta  (duloxetine ) for chronic musculoskeletal pain. -Start Cymbalta  30mg  daily for 1 week, then increase to 60mg  daily. -Check response to treatment in 2 months. -Consider genicular artery embolization if pain persists despite medication.  Follow-up in 2 months to assess response to Cymbalta  and discuss  further management options for knee osteoarthritis.      Relevant Medications   DULoxetine  (CYMBALTA ) 30 MG capsule   traMADol  (ULTRAM ) 50 MG tablet   Primary osteoarthritis of right knee   Continues to do reasonably well and without reported complaints.      Relevant Medications   DULoxetine  (CYMBALTA ) 30 MG capsule   traMADol  (ULTRAM ) 50 MG tablet     Orders & Medications Medications:  Meds ordered this encounter  Medications   DULoxetine  (CYMBALTA ) 30 MG capsule    Sig: Take 1 capsule (30 mg total) by mouth every evening for 7 days, THEN 2 capsules (60 mg total) every evening.    Dispense:  127 capsule    Refill:  0   traMADol  (ULTRAM ) 50 MG tablet    Sig: Take 1 tablet (50 mg total) by mouth every 8 (eight) hours as needed for up to 5 days.    Dispense:  15 tablet    Refill:  0   No orders of the defined types were placed in this encounter.    Return in about 2 months (around 05/01/2023) for f/u L>R knees, medication mgmt.     Selinda JINNY Ku, MD, [facility]   Primary Care Sports Medicine Primary Care and Sports Medicine at [facility]

## 2023-03-03 NOTE — Patient Instructions (Signed)
-  Start Cymbalta 30mg  daily for 1 week, then increase to 60mg  daily. -Follow-up in 2 months to assess response to Cymbalta.

## 2023-03-03 NOTE — Assessment & Plan Note (Signed)
 Continues to do reasonably well and without reported complaints.

## 2023-03-03 NOTE — Assessment & Plan Note (Addendum)
 History of Present Illness Mr. Koren, a patient with chronic left knee pain, reports that the relief from cortisone injections (left knee injection 01/28/2023) has been short-lived, lasting only about two weeks. The pain has been persistent and has not responded well to multiple cortisone injections. The patient has been managing the pain with over-the-counter arthritis pills and tramadol , which he takes at night to help him sleep. He also mentions a past injury to his ipsilateral Achilles tendon, which may have affected his gait and potentially contributed to his knee pain.  Assessment and Plan Left knee Osteoarthritis-chronic Persistent pain despite cortisone injections. Discussed the use of Cymbalta  (duloxetine ) for chronic musculoskeletal pain. -Start Cymbalta  30mg  daily for 1 week, then increase to 60mg  daily. -Check response to treatment in 2 months. -Consider genicular artery embolization if pain persists despite medication.  Follow-up in 2 months to assess response to Cymbalta  and discuss further management options for knee osteoarthritis.

## 2023-04-04 ENCOUNTER — Ambulatory Visit: Payer: PPO | Admitting: Family Medicine

## 2023-04-04 ENCOUNTER — Other Ambulatory Visit: Payer: Self-pay | Admitting: Family Medicine

## 2023-04-04 ENCOUNTER — Encounter: Payer: Self-pay | Admitting: Family Medicine

## 2023-04-04 VITALS — BP 112/76 | HR 95 | Ht 68.0 in | Wt 260.0 lb

## 2023-04-04 DIAGNOSIS — I10 Essential (primary) hypertension: Secondary | ICD-10-CM

## 2023-04-04 DIAGNOSIS — N342 Other urethritis: Secondary | ICD-10-CM | POA: Diagnosis not present

## 2023-04-04 DIAGNOSIS — E119 Type 2 diabetes mellitus without complications: Secondary | ICD-10-CM | POA: Diagnosis not present

## 2023-04-04 DIAGNOSIS — E782 Mixed hyperlipidemia: Secondary | ICD-10-CM | POA: Diagnosis not present

## 2023-04-04 DIAGNOSIS — R300 Dysuria: Secondary | ICD-10-CM | POA: Diagnosis not present

## 2023-04-04 DIAGNOSIS — N411 Chronic prostatitis: Secondary | ICD-10-CM | POA: Diagnosis not present

## 2023-04-04 DIAGNOSIS — Z7984 Long term (current) use of oral hypoglycemic drugs: Secondary | ICD-10-CM | POA: Diagnosis not present

## 2023-04-04 LAB — POCT URINALYSIS DIPSTICK
Appearance: NORMAL
Bilirubin, UA: NEGATIVE
Blood, UA: NEGATIVE
Glucose, UA: NEGATIVE
Ketones, UA: NEGATIVE
Nitrite, UA: NEGATIVE
Odor: NEGATIVE
Protein, UA: NEGATIVE
Spec Grav, UA: 1.025 (ref 1.010–1.025)
Urobilinogen, UA: NEGATIVE U/dL — AB
pH, UA: 6 (ref 5.0–8.0)

## 2023-04-04 MED ORDER — LISINOPRIL 10 MG PO TABS: 10.0000 mg | ORAL_TABLET | Freq: Every day | ORAL | 1 refills | Status: DC

## 2023-04-04 MED ORDER — SULFAMETHOXAZOLE-TRIMETHOPRIM 800-160 MG PO TABS: 1.0000 | ORAL_TABLET | Freq: Two times a day (BID) | ORAL | 0 refills | Status: AC

## 2023-04-04 MED ORDER — ATORVASTATIN CALCIUM 10 MG PO TABS: 10.0000 mg | ORAL_TABLET | Freq: Every day | ORAL | 1 refills | Status: DC

## 2023-04-04 MED ORDER — METFORMIN HCL 500 MG PO TABS: 500.0000 mg | ORAL_TABLET | Freq: Every day | ORAL | 1 refills | Status: DC

## 2023-04-04 MED ORDER — FLUCONAZOLE 150 MG PO TABS: 150.0000 mg | ORAL_TABLET | Freq: Once | ORAL | 0 refills | Status: AC

## 2023-04-04 NOTE — Progress Notes (Signed)
 Date:  04/04/2023   Name:  Brandon Perez   DOB:  [birth year]   MRN:  [id number]   Chief Complaint: Dysuria, Hyperlipidemia, Diabetes, and Hypertension  Dysuria  This is a new problem. The current episode started 1 to 4 weeks ago. The problem has been waxing and waning. The quality of the pain is described as burning. The pain is moderate. There has been no fever. Pertinent negatives include no chills, discharge, flank pain, frequency, hematuria, hesitancy, nausea, sweats, urgency or vomiting. Associated symptoms comments: nocturia. He has tried increased fluids for the symptoms. The treatment provided no relief.  Hyperlipidemia This is a chronic problem. The current episode started more than 1 year ago. The problem is controlled. Recent lipid tests were reviewed and are normal. Exacerbating diseases include diabetes and obesity. He has no history of chronic renal disease. Pertinent negatives include no chest pain, focal sensory loss, focal weakness, leg pain, myalgias or shortness of breath. He is currently on no antihyperlipidemic treatment. There are no compliance problems.  Risk factors for coronary artery disease include dyslipidemia and hypertension.  Diabetes He presents for his follow-up diabetic visit. He has type 2 diabetes mellitus. His disease course has been stable. There are no hypoglycemic associated symptoms. Pertinent negatives for hypoglycemia include no dizziness, headaches, nervousness/anxiousness, sleepiness or sweats. There are no diabetic associated symptoms. Pertinent negatives for diabetes include no blurred vision and no chest pain. There are no hypoglycemic complications. Symptoms are stable. There are no diabetic complications. His weight is decreasing steadily. Meal planning includes avoidance of concentrated sweets. He rarely participates in exercise. His home blood glucose trend is fluctuating minimally. An ACE inhibitor/angiotensin II receptor blocker is being taken.   Hypertension This is a chronic problem. The current episode started more than 1 year ago. The problem has been gradually improving since onset. The problem is controlled. Pertinent negatives include no blurred vision, chest pain, headaches, malaise/fatigue, orthopnea, palpitations, peripheral edema, shortness of breath or sweats. There are no associated agents to hypertension. There is no history of chronic renal disease.    Lab Results  Component Value Date   NA 134 10/18/2022   K 5.0 10/18/2022   CO2 25 10/18/2022   GLUCOSE 102 (H) 10/18/2022   BUN 20 10/18/2022   CREATININE 1.14 10/18/2022   CALCIUM  9.7 10/18/2022   EGFR 70 10/18/2022   GFRNONAA 69 03/14/2019   Lab Results  Component Value Date   CHOL 170 10/18/2022   HDL 44 10/18/2022   LDLCALC 81 10/18/2022   TRIG 273 (H) 10/18/2022   No results found for: "TSH" Lab Results  Component Value Date   HGBA1C 7.0 (H) 10/18/2022   No results found for: "WBC", "HGB", "HCT", "MCV", "PLT" Lab Results  Component Value Date   ALT 15 10/18/2022   AST 13 10/18/2022   ALKPHOS 73 10/18/2022   BILITOT 0.4 10/18/2022   No results found for: "25OHVITD2", "25OHVITD3", "VD25OH"   Review of Systems  Constitutional:  Negative for chills and malaise/fatigue.  Eyes:  Negative for blurred vision and visual disturbance.  Respiratory:  Negative for cough, choking, chest tightness, shortness of breath and wheezing.   Cardiovascular:  Negative for chest pain, palpitations, orthopnea and leg swelling.  Gastrointestinal:  Negative for abdominal pain, nausea and vomiting.  Genitourinary:  Positive for dysuria. Negative for difficulty urinating, flank pain, frequency, hematuria, hesitancy, penile swelling, testicular pain and urgency.  Musculoskeletal:  Negative for myalgias.  Neurological:  Negative for dizziness, focal weakness and  headaches.  Psychiatric/Behavioral:  The patient is not nervous/anxious.     Patient Active Problem List    Diagnosis Date Noted   Achilles tendon tear, left, initial encounter 11/09/2021   Primary osteoarthritis of left knee 05/14/2021   Primary osteoarthritis of right knee 05/14/2021   Calcific Achilles tendinitis of left lower extremity 05/14/2021    Allergies  Allergen Reactions   Penicillin G Other (See Comments)    As a child    Past Surgical History:  Procedure Laterality Date   COLONOSCOPY  02/23/2012   normal    Social History   Tobacco Use   Smoking status: Former    Current packs/day: 0.00    Average packs/day: 1 pack/day for 30.0 years (30.0 ttl pk-yrs)    Types: Cigarettes    Start date: 05/23/1989    Quit date: 05/24/2019    Years since quitting: 3.8   Smokeless tobacco: Never  Vaping Use   Vaping status: Never Used  Substance Use Topics   Alcohol use: No    Alcohol/week: 0.0 standard drinks of alcohol   Drug use: No     Medication list has been reviewed and updated.  Current Meds  Medication Sig   aspirin 81 MG tablet Take 81 mg by mouth daily.   atorvastatin  (LIPITOR) 10 MG tablet Take 1 tablet (10 mg total) by mouth daily.   DULoxetine  (CYMBALTA ) 30 MG capsule Take 1 capsule (30 mg total) by mouth every evening for 7 days, THEN 2 capsules (60 mg total) every evening.   lisinopril  (ZESTRIL ) 10 MG tablet Take 1 tablet (10 mg total) by mouth daily.   metFORMIN  (GLUCOPHAGE ) 500 MG tablet Take 1 tablet (500 mg total) by mouth daily with breakfast.       10/15/2022    9:28 AM 04/16/2022    9:52 AM 12/14/2021    9:09 AM 08/19/2021    8:36 AM  GAD 7 : Generalized Anxiety Score  Nervous, Anxious, on Edge 0 0 0 0  Control/stop worrying 0 0 0 0  Worry too much - different things 0 0 0 0  Trouble relaxing 0 0 0 0  Restless 0 0 0 0  Easily annoyed or irritable 0 0 0 0  Afraid - awful might happen 0 0 0 0  Total GAD 7 Score 0 0 0 0  Anxiety Difficulty Not difficult at all Not difficult at all Not difficult at all Not difficult at all       10/15/2022    9:28  AM 04/29/2022    8:47 AM 04/16/2022    9:52 AM  Depression screen PHQ 2/9  Decreased Interest 0 0 0  Down, Depressed, Hopeless 0 0 0  PHQ - 2 Score 0 0 0  Altered sleeping 0 0 0  Tired, decreased energy 0 0 0  Change in appetite 0 0 0  Feeling bad or failure about yourself  0 0 0  Trouble concentrating 0 0 0  Moving slowly or fidgety/restless 0 0 0  Suicidal thoughts 0 0 0  PHQ-9 Score 0 0 0  Difficult doing work/chores Not difficult at all Not difficult at all Not difficult at all    BP Readings from Last 3 Encounters:  04/04/23 112/76  03/03/23 120/84  01/28/23 112/70    Physical Exam Vitals and nursing note reviewed.  HENT:     Head: Normocephalic.     Right Ear: Tympanic membrane normal.     Left Ear: Tympanic membrane normal.  Pulmonary:     Breath sounds: No wheezing, rhonchi or rales.  Chest:     Chest wall: No tenderness.  Abdominal:     Tenderness: There is no abdominal tenderness. There is no right CVA tenderness, left CVA tenderness or guarding.  Genitourinary:    Penis: Normal.      Testes: Normal.     Epididymis:     Right: Normal.     Left: Normal.     Prostate: Enlarged and tender. No nodules present.     Rectum: Normal. Guaiac result negative. No mass.  Neurological:     Mental Status: He is alert.     Wt Readings from Last 3 Encounters:  04/04/23 260 lb (117.9 kg)  03/03/23 266 lb (120.7 kg)  01/28/23 269 lb 12.8 oz (122.4 kg)    BP 112/76   Pulse 95   Ht 5\' 8"  (1.727 m)   Wt 260 lb (117.9 kg)   SpO2 95%   BMI 39.53 kg/m   Assessment and Plan: 1. Dysuria (Primary) Chronic.  Controlled.  Stable.  Urinalysis is unremarkable we will send culture in the meantime on examination the prostate was having some mild tenderness on palpation and slightly enlarged and boggy this could be consistent with an early or subacute prostatitis and we will treat with Septra  DS twice a day for 10 days. - POCT Urinalysis Dipstick - Urine Culture -  sulfamethoxazole -trimethoprim  (BACTRIM  DS) 800-160 MG tablet; Take 1 tablet by mouth 2 (two) times daily for 10 days.  Dispense: 20 tablet; Refill: 0  2. Mixed hyperlipidemia Chronic.  Controlled.  Stable.  Asymptomatic without myalgias or muscle weakness.  Continue atorvastatin  10 mg once a day.  Will check lipid panel with CMP. - atorvastatin  (LIPITOR) 10 MG tablet; Take 1 tablet (10 mg total) by mouth daily.  Dispense: 90 tablet; Refill: 1 - Lipid Panel With LDL/HDL Ratio - Comprehensive metabolic panel  3. Essential hypertension Chronic.  Controlled.  Stable.  Blood pressure is 112/76.  Asymptomatic.  Tolerating lisinopril  10 mg once a day.  Will check CMP for electrolytes and GFR. - lisinopril  (ZESTRIL ) 10 MG tablet; Take 1 tablet (10 mg total) by mouth daily.  Dispense: 90 tablet; Refill: 1 - Comprehensive metabolic panel  4. Diabetes mellitus treated with oral medication (HCC) Chronic.  Controlled.  Stable.  Continue metformin  500 mg once a day.  Will check hemoglobin A1c for current status of control as well as comprehensive metabolic panel for GFR and electrolytes. - metFORMIN  (GLUCOPHAGE ) 500 MG tablet; Take 1 tablet (500 mg total) by mouth daily with breakfast.  Dispense: 90 tablet; Refill: 1 - Hemoglobin A1c - Lipid Panel With LDL/HDL Ratio - Comprehensive metabolic panel  5. Subacute prostatitis DRE notes a prostate is upper limits of normal or slightly enlarged that is soft with mild tenderness.  This could be consistent with a subacute prostatitis and we will treat with Septra  DS twice daily for 10 days as well as follow-up with PSA as well. - sulfamethoxazole -trimethoprim  (BACTRIM  DS) 800-160 MG tablet; Take 1 tablet by mouth 2 (two) times daily for 10 days.  Dispense: 20 tablet; Refill: 0 - PSA  6. Urethritis Patient has had some burning which may be progression of the subacute prostatitis however there may also be a candidiasis etiology given the patient's diabetic state  and we will treat with Diflucan  150 mg after the last dose of antibiotic. - sulfamethoxazole -trimethoprim  (BACTRIM  DS) 800-160 MG tablet; Take 1 tablet by mouth 2 (two)  times daily for 10 days.  Dispense: 20 tablet; Refill: 0 - fluconazole  (DIFLUCAN ) 150 MG tablet; Take 1 tablet (150 mg total) by mouth once for 1 dose.  Dispense: 1 tablet; Refill: 0     Alayne Allis, MD

## 2023-04-05 ENCOUNTER — Encounter: Payer: Self-pay | Admitting: Family Medicine

## 2023-04-05 LAB — COMPREHENSIVE METABOLIC PANEL
ALT: 16 [IU]/L (ref 0–44)
AST: 17 [IU]/L (ref 0–40)
Albumin: 4.2 g/dL (ref 3.9–4.9)
Alkaline Phosphatase: 85 [IU]/L (ref 44–121)
BUN/Creatinine Ratio: 11 (ref 10–24)
BUN: 12 mg/dL (ref 8–27)
Bilirubin Total: 0.3 mg/dL (ref 0.0–1.2)
CO2: 21 mmol/L (ref 20–29)
Calcium: 9 mg/dL (ref 8.6–10.2)
Chloride: 99 mmol/L (ref 96–106)
Creatinine, Ser: 1.13 mg/dL (ref 0.76–1.27)
Globulin, Total: 2.5 g/dL (ref 1.5–4.5)
Glucose: 99 mg/dL (ref 70–99)
Potassium: 4.4 mmol/L (ref 3.5–5.2)
Sodium: 136 mmol/L (ref 134–144)
Total Protein: 6.7 g/dL (ref 6.0–8.5)
eGFR: 70 mL/min/{1.73_m2} (ref >59)

## 2023-04-05 LAB — LIPID PANEL WITH LDL/HDL RATIO
Cholesterol, Total: 149 mg/dL (ref 100–199)
HDL: 37 mg/dL — ABNORMAL LOW (ref >39)
LDL Chol Calc (NIH): 87 mg/dL (ref 0–99)
LDL/HDL Ratio: 2.4 {ratio} (ref 0.0–3.6)
Triglycerides: 144 mg/dL (ref 0–149)
VLDL Cholesterol Cal: 25 mg/dL (ref 5–40)

## 2023-04-05 LAB — HEMOGLOBIN A1C
Est. average glucose Bld gHb Est-mCnc: 154 mg/dL
Hgb A1c MFr Bld: 7 % — ABNORMAL HIGH (ref 4.8–5.6)

## 2023-04-05 LAB — PSA: Prostate Specific Ag, Serum: 1 ng/mL (ref 0.0–4.0)

## 2023-04-07 LAB — URINE CULTURE

## 2023-04-18 ENCOUNTER — Encounter: Payer: Self-pay | Admitting: Family Medicine

## 2023-04-18 ENCOUNTER — Other Ambulatory Visit: Payer: Self-pay | Admitting: Family Medicine

## 2023-04-18 ENCOUNTER — Ambulatory Visit: Payer: Self-pay | Admitting: Family Medicine

## 2023-04-18 ENCOUNTER — Ambulatory Visit (INDEPENDENT_AMBULATORY_CARE_PROVIDER_SITE_OTHER): Payer: PPO | Admitting: Family Medicine

## 2023-04-18 VITALS — BP 104/78 | HR 100 | Ht 68.0 in | Wt 260.0 lb

## 2023-04-18 DIAGNOSIS — R300 Dysuria: Secondary | ICD-10-CM

## 2023-04-18 DIAGNOSIS — N342 Other urethritis: Secondary | ICD-10-CM | POA: Diagnosis not present

## 2023-04-18 LAB — POCT URINALYSIS DIPSTICK
Bilirubin, UA: NEGATIVE
Blood, UA: NEGATIVE
Glucose, UA: NEGATIVE
Ketones, UA: NEGATIVE
Leukocytes, UA: NEGATIVE
Nitrite, UA: NEGATIVE
Protein, UA: NEGATIVE
Spec Grav, UA: 1.02 (ref 1.010–1.025)
Urobilinogen, UA: 0.2 U/dL
pH, UA: 6 (ref 5.0–8.0)

## 2023-04-18 MED ORDER — FLUCONAZOLE 150 MG PO TABS: 150.0000 mg | ORAL_TABLET | Freq: Once | ORAL | 0 refills | Status: AC

## 2023-04-18 NOTE — Progress Notes (Signed)
 Date:  04/18/2023   Name:  Brandon Perez   DOB:  Nov 07, 1955   MRN:  [id number]   Chief Complaint: Dysuria (2/10 still having symptoms, antibiotics did not help, no blood)  Dysuria  This is a new problem. The current episode started 1 to 4 weeks ago (3 weeks). The problem has been unchanged. The quality of the pain is described as burning. The pain is mild. He is Not sexually active. Associated symptoms include frequency and urgency. Pertinent negatives include no chills, discharge, flank pain, hematuria, hesitancy, nausea, sweats or vomiting. He has tried antibiotics (septra) for the symptoms. The treatment provided moderate relief.    Lab Results  Component Value Date   NA 136 04/04/2023   K 4.4 04/04/2023   CO2 21 04/04/2023   GLUCOSE 99 04/04/2023   BUN 12 04/04/2023   CREATININE 1.13 04/04/2023   CALCIUM 9.0 04/04/2023   EGFR 70 04/04/2023   GFRNONAA 69 03/14/2019   Lab Results  Component Value Date   CHOL 149 04/04/2023   HDL 37 (L) 04/04/2023   LDLCALC 87 04/04/2023   TRIG 144 04/04/2023   No results found for: "TSH" Lab Results  Component Value Date   HGBA1C 7.0 (H) 04/04/2023   No results found for: "WBC", "HGB", "HCT", "MCV", "PLT" Lab Results  Component Value Date   ALT 16 04/04/2023   AST 17 04/04/2023   ALKPHOS 85 04/04/2023   BILITOT 0.3 04/04/2023   No results found for: "25OHVITD2", "25OHVITD3", "VD25OH"   Review of Systems  Constitutional:  Negative for chills, fatigue and fever.  Respiratory:  Negative for chest tightness and shortness of breath.   Cardiovascular:  Negative for chest pain and palpitations.  Gastrointestinal:  Negative for nausea and vomiting.  Genitourinary:  Positive for dysuria, frequency, penile pain and urgency. Negative for difficulty urinating, flank pain, genital sores, hematuria, hesitancy, penile discharge, scrotal swelling and testicular pain.    Patient Active Problem List   Diagnosis Date Noted   Achilles tendon  tear, left, initial encounter 11/09/2021   Primary osteoarthritis of left knee 05/14/2021   Primary osteoarthritis of right knee 05/14/2021   Calcific Achilles tendinitis of left lower extremity 05/14/2021    Allergies  Allergen Reactions   Penicillin G Other (See Comments)    As a child    Past Surgical History:  Procedure Laterality Date   COLONOSCOPY  02/23/2012   normal    Social History   Tobacco Use   Smoking status: Former    Current packs/day: 0.00    Average packs/day: 1 pack/day for 30.0 years (30.0 ttl pk-yrs)    Types: Cigarettes    Start date: 05/23/1989    Quit date: 05/24/2019    Years since quitting: 3.9   Smokeless tobacco: Never  Vaping Use   Vaping status: Never Used  Substance Use Topics   Alcohol use: No    Alcohol/week: 0.0 standard drinks of alcohol   Drug use: No     Medication list has been reviewed and updated.  Current Meds  Medication Sig   aspirin 81 MG tablet Take 81 mg by mouth daily.   atorvastatin (LIPITOR) 10 MG tablet Take 1 tablet (10 mg total) by mouth daily.   DULoxetine (CYMBALTA) 30 MG capsule Take 1 capsule (30 mg total) by mouth every evening for 7 days, THEN 2 capsules (60 mg total) every evening.   lisinopril (ZESTRIL) 10 MG tablet Take 1 tablet (10 mg total) by mouth daily.  metFORMIN (GLUCOPHAGE) 500 MG tablet Take 1 tablet (500 mg total) by mouth daily with breakfast.       04/18/2023    2:37 PM 10/15/2022    9:28 AM 04/16/2022    9:52 AM 12/14/2021    9:09 AM  GAD 7 : Generalized Anxiety Score  Nervous, Anxious, on Edge 0 0 0 0  Control/stop worrying 0 0 0 0  Worry too much - different things 0 0 0 0  Trouble relaxing 0 0 0 0  Restless 0 0 0 0  Easily annoyed or irritable 0 0 0 0  Afraid - awful might happen 0 0 0 0  Total GAD 7 Score 0 0 0 0  Anxiety Difficulty Not difficult at all Not difficult at all Not difficult at all Not difficult at all       04/18/2023    2:37 PM 10/15/2022    9:28 AM 04/29/2022     8:47 AM  Depression screen PHQ 2/9  Decreased Interest 0 0 0  Down, Depressed, Hopeless 0 0 0  PHQ - 2 Score 0 0 0  Altered sleeping  0 0  Tired, decreased energy  0 0  Change in appetite  0 0  Feeling bad or failure about yourself   0 0  Trouble concentrating  0 0  Moving slowly or fidgety/restless  0 0  Suicidal thoughts  0 0  PHQ-9 Score  0 0  Difficult doing work/chores  Not difficult at all Not difficult at all    BP Readings from Last 3 Encounters:  04/18/23 104/78  04/04/23 112/76  03/03/23 120/84    Physical Exam Vitals and nursing note reviewed.  Constitutional:      Appearance: He is well-developed.  HENT:     Head: Normocephalic and atraumatic.     Right Ear: Tympanic membrane, ear canal and external ear normal.     Left Ear: Tympanic membrane, ear canal and external ear normal.     Nose: Nose normal.     Mouth/Throat:     Dentition: Normal dentition.     Pharynx: Oropharynx is clear.  Eyes:     General: Lids are normal. No scleral icterus.    Conjunctiva/sclera: Conjunctivae normal.     Pupils: Pupils are equal, round, and reactive to light.  Neck:     Thyroid: No thyromegaly.     Vascular: No carotid bruit, hepatojugular reflux or JVD.     Trachea: No tracheal deviation.  Cardiovascular:     Rate and Rhythm: Normal rate and regular rhythm.     Heart sounds: Normal heart sounds. No murmur heard.    No friction rub. No gallop.  Pulmonary:     Effort: Pulmonary effort is normal.     Breath sounds: Normal breath sounds. No wheezing, rhonchi or rales.  Abdominal:     General: Bowel sounds are normal.     Palpations: Abdomen is soft. There is no hepatomegaly, splenomegaly or mass.     Tenderness: There is no abdominal tenderness. There is no right CVA tenderness or left CVA tenderness.     Hernia: There is no hernia in the left inguinal area.  Musculoskeletal:        General: Normal range of motion.     Cervical back: Normal range of motion and neck  supple.  Lymphadenopathy:     Cervical: No cervical adenopathy.  Skin:    General: Skin is warm and dry.     Findings: No  rash.  Neurological:     Mental Status: He is alert and oriented to person, place, and time.     Sensory: No sensory deficit.     Deep Tendon Reflexes: Reflexes are normal and symmetric.  Psychiatric:        Mood and Affect: Mood is not anxious or depressed.     Wt Readings from Last 3 Encounters:  04/18/23 260 lb (117.9 kg)  04/04/23 260 lb (117.9 kg)  03/03/23 266 lb (120.7 kg)    BP 104/78   Pulse 100   Ht 5\' 8"  (1.727 m)   Wt 260 lb (117.9 kg)   SpO2 96%   BMI 39.53 kg/m   Assessment and Plan:  1. Dysuria (Primary) New onset.  Persistent.  Relatively stable.  Despite course of antibiotic which was Septra DS twice a day for 10 days patient continues to be symptomatic.  Differential would include perhaps an atypical, candidiasis since the patient is prediabetic, or mechanical such as a stricture.  We will repeat a culture to make sure that this was not a mistake since it was grown out as a lactobacilli species and whether or not there is another organism that is similar that could indeed cause this and I am hoping that they will do sensitivities this time.  We will do urine microscopy to see if there is any organisms like trichomonas or if there is the presence of leukocytes.  We will treat with Diflucan 150 mg times once in the event that there may be a candidiasis infection.  And will refer to urology for evaluation for possible mechanical concern. - POCT urinalysis dipstick - Urine Microscopic - Urine Culture - Ambulatory referral to Urology - fluconazole (DIFLUCAN) 150 MG tablet; Take 1 tablet (150 mg total) by mouth once for 1 dose.  Dispense: 1 tablet; Refill: 0  2. Urethritis As noted above we will treat with Diflucan 150 mg in the event that this is a yeast infection since it was not treated on the last dosing course. - fluconazole (DIFLUCAN) 150  MG tablet; Take 1 tablet (150 mg total) by mouth once for 1 dose.  Dispense: 1 tablet; Refill: 0    Elizabeth Sauer, MD

## 2023-04-18 NOTE — Telephone Encounter (Signed)
 Pt has an appointment.  KP

## 2023-04-18 NOTE — Telephone Encounter (Signed)
 Copied from CRM [id number]. Topic: Clinical - Medical Advice >> Apr 18, 2023  8:40 AM Alessandra Bevels wrote: Reason for CRM: Pt is calling to request a refill on sulfamethoxazole-trimethoprim (BACTRIM DS) 800-160 MG tablet [[id number]]  ENDED to be sent to [facility] [address] [place] - [address] [place] [address] Phone: [phone]  Fax: [phone]  Patient reporting that he was seen in office on 04/04/23 for Dysuria and patient is still reporting burning with urination. Please advise    Chief Complaint: Painful urination Symptoms: Pain and burning when trying to urinate, frequency Frequency: Ongoing for 3 weeks Pertinent Negatives: Patient denies fever, blood in urine, flank pain, pelvic pain Disposition: [] ED /[] Urgent Care (no appt availability in office) / [x] Appointment(In office/virtual)/ []  [facility]/ [] Home Care/ [] Refused Recommended Disposition /[] [facility]/ []  Follow-up with PCP Additional Notes: Patient stated that he is still having burning with urination and frequency. He was seen for this problem on 2/10 and symptoms improved a little bit but have not resolved. Patient completed antibiotics last Thursday. Same day appointment has been scheduled.   Reason for Disposition  All other males with painful urination  Answer Assessment - Initial Assessment Questions 1. SEVERITY: "How bad is the pain?"  (e.g., Scale 1-10; mild, moderate, or severe)   - MILD (1-3): Complains slightly about urination hurting.   - MODERATE (4-7): Interferes with normal activities.     - SEVERE (8-10): Excruciating, unwilling or unable to urinate because of the pain.      Moderate  2. PATTERN: "Is pain present every time you urinate or just sometimes?"      Yes, every time  3. ONSET: "When did the painful urination start?"      3 weeks ago  4. FEVER: "Do you have a fever?" If Yes, ask: "What is your temperature, how was it measured, and  when did it start?"     No  5. PAST UTI: "Have you had a urine infection before?" If Yes, ask: "When was the last time?" and "What happened that time?"      Yes, patient was given antibiotics  6. OTHER SYMPTOMS: "Do you have any other symptoms?" (e.g., flank pain, penis discharge, scrotal pain, blood in urine)     Urinary frequency  Protocols used: Urination Pain - Male-A-AH

## 2023-04-19 LAB — URINALYSIS, MICROSCOPIC ONLY
Bacteria, UA: NONE SEEN
Casts: NONE SEEN /[LPF]
Epithelial Cells (non renal): NONE SEEN /[HPF] (ref 0–10)
RBC, Urine: NONE SEEN /[HPF] (ref 0–2)
WBC, UA: NONE SEEN /[HPF] (ref 0–5)

## 2023-04-20 ENCOUNTER — Telehealth: Payer: Self-pay

## 2023-04-20 LAB — URINE CULTURE: Organism ID, Bacteria: NO GROWTH

## 2023-04-20 LAB — SPECIMEN STATUS REPORT

## 2023-04-20 NOTE — Telephone Encounter (Signed)
 Called pt with lab results.  KP Copied from CRM [id number]. Topic: Clinical - Lab/Test Results >> Apr 20, 2023 11:14 AM Tiffany B wrote: Reason for CRM: Patient returning call regarding lab results.

## 2023-04-21 ENCOUNTER — Ambulatory Visit: Payer: Self-pay | Admitting: Family Medicine

## 2023-05-02 ENCOUNTER — Other Ambulatory Visit (INDEPENDENT_AMBULATORY_CARE_PROVIDER_SITE_OTHER): Payer: Self-pay | Admitting: Radiology

## 2023-05-02 ENCOUNTER — Ambulatory Visit: Payer: PPO | Admitting: Family Medicine

## 2023-05-02 ENCOUNTER — Encounter: Payer: Self-pay | Admitting: Family Medicine

## 2023-05-02 DIAGNOSIS — M1711 Unilateral primary osteoarthritis, right knee: Secondary | ICD-10-CM

## 2023-05-02 DIAGNOSIS — M1712 Unilateral primary osteoarthritis, left knee: Secondary | ICD-10-CM

## 2023-05-02 DIAGNOSIS — M170 Bilateral primary osteoarthritis of knee: Secondary | ICD-10-CM | POA: Diagnosis not present

## 2023-05-02 MED ORDER — TRIAMCINOLONE ACETONIDE 40 MG/ML IJ SUSP
40.0000 mg | Freq: Once | INTRAMUSCULAR | Status: AC
  Administered 2023-05-02: 40 mg via INTRAMUSCULAR

## 2023-05-02 MED ORDER — DULOXETINE HCL 60 MG PO CPEP: 60.0000 mg | ORAL_CAPSULE | Freq: Every evening | ORAL | 0 refills | Status: DC

## 2023-05-02 NOTE — Progress Notes (Signed)
 Primary Care / Sports Medicine Office Visit  Patient Information:  Patient ID: Brandon Perez, male DOB: [birth year] Age: 68 y.o. MRN: [id number]   Brandon Perez is a pleasant 68 y.o. male presenting with the following:  Chief Complaint  Patient presents with   Knee Pain    Patient presents today for a follow up on his left knee. His left knee is still bothering him. He states the medication has helped some but he would like a cortisone injection today.     Vitals:   05/02/23 0808  BP: 100/76  Pulse: (!) 103  SpO2: 98%   Vitals:   05/02/23 0808  Weight: 259 lb (117.5 kg)  Height: 5\' 8"  (1.727 m)   Body mass index is 39.38 kg/m.  No results found.   Independent interpretation of notes and tests performed by another provider:   None  Procedures performed:   Procedure:  Injection of left knee under ultrasound guidance. Ultrasound guidance utilized for anteromedial approach, joint lines visualized Samsung HS60 device utilized with permanent recording / reporting. Verbal informed consent obtained and verified. Skin prepped in a sterile fashion. Ethyl chloride for topical local analgesia.  Completed without difficulty and tolerated well. Medication: triamcinolone acetonide 40 mg/mL suspension for injection 1 mL total and 2 mL lidocaine 1% without epinephrine utilized for needle placement anesthetic Advised to contact for fevers/chills, erythema, induration, drainage, or persistent bleeding.  Pertinent History, Exam, Impression, and Recommendations:   Problem List Items Addressed This Visit     Primary osteoarthritis of left knee   History of Present Illness He presents with left knee pain and follow-up on associated Cymbalta treatment for this.  He experiences persistent left knee pain, particularly along the medial joint line, which has not significantly improved with previous cortisone injections. He has previously received a gel injection in the left knee, which  provided limited relief, but the pain has returned. No significant pain in other areas such as the right knee, shoulder, and back.  He is following up to assess the effectiveness of Cymbalta, which he has been taking at a dose of 60 mg daily for about three weeks. His symptoms are 'a little better'.  We did discuss that it may take up to six to eight weeks to see full effects.  He has utilized an unloader brace in the past with minimal benefit, as such he does not wear it regularly anymore. He has also purchased an OTC vibrating device for his knee, which he uses occasionally with reported benefit.  He is active and tries to incorporate exercises into his routine, such as when sitting in a chair. He has a large family with twelve siblings, and he is active, often [place] time [place] and working on projects like installing a new floor in his outdoor kitchen.  Physical Exam PALPATION: Medial joint line tenderness in left knee. No effusion in left knee.  Lateral joint line, facets benign, no laxity with stressing.  Assessment and Plan Left knee Osteoarthritis Persistent pain despite Cymbalta 60mg  daily for 3 weeks. Some improvement noted. Cortisone injections have provided limited relief in the past. Discussed various treatment options including PRP, genicular nerve block, SAM ultrasound device, and total knee replacement. -Administer intra-articular cortisone injection today under ultrasound guidance to the left knee. -Continue Cymbalta 60mg  daily and reassess in a few more weeks. -Consider genicular nerve block if pain persists. -Consider PRP or knee replacement if other options are ineffective. -Provide exercises for home therapy. -  Contact [employer] representative about SAM device for potential use. -Check in 3 months for follow-up.      Relevant Medications   DULoxetine (CYMBALTA) 60 MG capsule   Other Relevant Orders   [place] LIMITED JOINT SPACE STRUCTURES LOW LEFT   Primary osteoarthritis  of right knee   Relevant Medications   DULoxetine (CYMBALTA) 60 MG capsule     Orders & Medications Medications:  Meds ordered this encounter  Medications   DULoxetine (CYMBALTA) 60 MG capsule    Sig: Take 1 capsule (60 mg total) by mouth every evening.    Dispense:  90 capsule    Refill:  0   triamcinolone acetonide (KENALOG-40) injection 40 mg   Orders Placed This Encounter  Procedures   [place] LIMITED JOINT SPACE STRUCTURES LOW LEFT     Return in about 3 months (around 08/02/2023).     Jerrol Banana, MD, [facility]   Primary Care Sports Medicine Primary Care and Sports Medicine at [facility]

## 2023-05-02 NOTE — Patient Instructions (Addendum)
 You have just been given a cortisone injection to reduce pain and inflammation. After the injection you may notice immediate relief of pain as a result of the Lidocaine. It is important to rest the area of the injection for 24 to 48 hours after the injection. There is a possibility of some temporary increased discomfort and swelling for up to 72 hours until the cortisone begins to work. If you do have pain, simply rest the joint and use ice. If you can tolerate over the counter medications, you can try Tylenol, Aleve, or Advil for added relief per package instructions.  Patient Plan  Left Knee Osteoarthritis:  1. Receive a cortisone injection in the left knee today. 2. Continue taking Cymbalta 60 mg daily.  New 60 mg capsule prescription sent to your pharmacy. 3. Perform prescribed home therapy exercises regularly. 4.  A [employer] representative will contact you about obtaining and using the SAM ultrasound device. 5. Follow up in 3 months to reassess condition and treatment effectiveness.  Future Options if Pain Persists:  1. Consider a genicular nerve block. 2. Consider PRP (Platelet-Rich Plasma) therapy or knee replacement if other treatments do not help.  Red Flags:  - Contact your healthcare provider if you experience significant worsening of pain, swelling, or any new symptoms.  Additionally:  Call Urology at [phone] to schedule appointment!

## 2023-05-02 NOTE — Assessment & Plan Note (Signed)
 History of Present Illness He presents with left knee pain and follow-up on associated Cymbalta treatment for this.  He experiences persistent left knee pain, particularly along the medial joint line, which has not significantly improved with previous cortisone injections. He has previously received a gel injection in the left knee, which provided limited relief, but the pain has returned. No significant pain in other areas such as the right knee, shoulder, and back.  He is following up to assess the effectiveness of Cymbalta, which he has been taking at a dose of 60 mg daily for about three weeks. His symptoms are 'a little better'.  We did discuss that it may take up to six to eight weeks to see full effects.  He has utilized an unloader brace in the past with minimal benefit, as such he does not wear it regularly anymore. He has also purchased an OTC vibrating device for his knee, which he uses occasionally with reported benefit.  He is active and tries to incorporate exercises into his routine, such as when sitting in a chair. He has a large family with twelve siblings, and he is active, often [place] time [place] and working on projects like installing a new floor in his outdoor kitchen.  Physical Exam PALPATION: Medial joint line tenderness in left knee. No effusion in left knee.  Lateral joint line, facets benign, no laxity with stressing.  Assessment and Plan Left knee Osteoarthritis Persistent pain despite Cymbalta 60mg  daily for 3 weeks. Some improvement noted. Cortisone injections have provided limited relief in the past. Discussed various treatment options including PRP, genicular nerve block, SAM ultrasound device, and total knee replacement. -Administer intra-articular cortisone injection today under ultrasound guidance to the left knee. -Continue Cymbalta 60mg  daily and reassess in a few more weeks. -Consider genicular nerve block if pain persists. -Consider PRP or knee  replacement if other options are ineffective. -Provide exercises for home therapy. -Contact [employer] representative about SAM device for potential use. -Check in 3 months for follow-up.

## 2023-05-08 IMAGING — DX DG KNEE COMPLETE 4+V*L*
4 series · 4 of 4 positions shown · non-contrast
Comparison: July 10, 2008

CLINICAL DATA: bilateral knee pain, worse in the left

EXAM:
LEFT KNEE - COMPLETE 4+ VIEW

[knee lat]
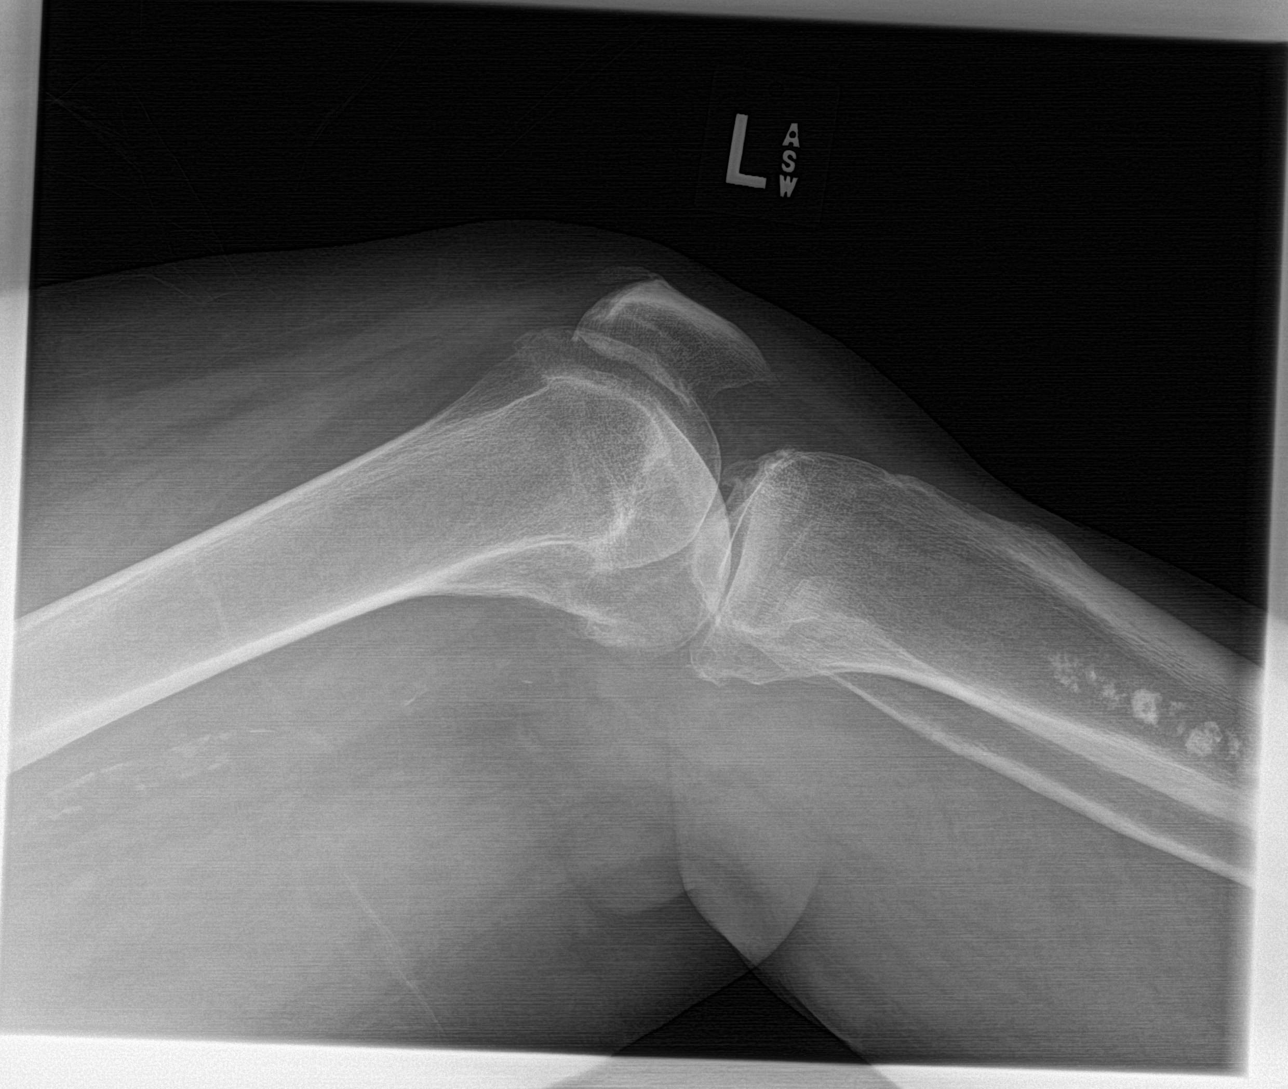

[knee obl (1 of 2)]
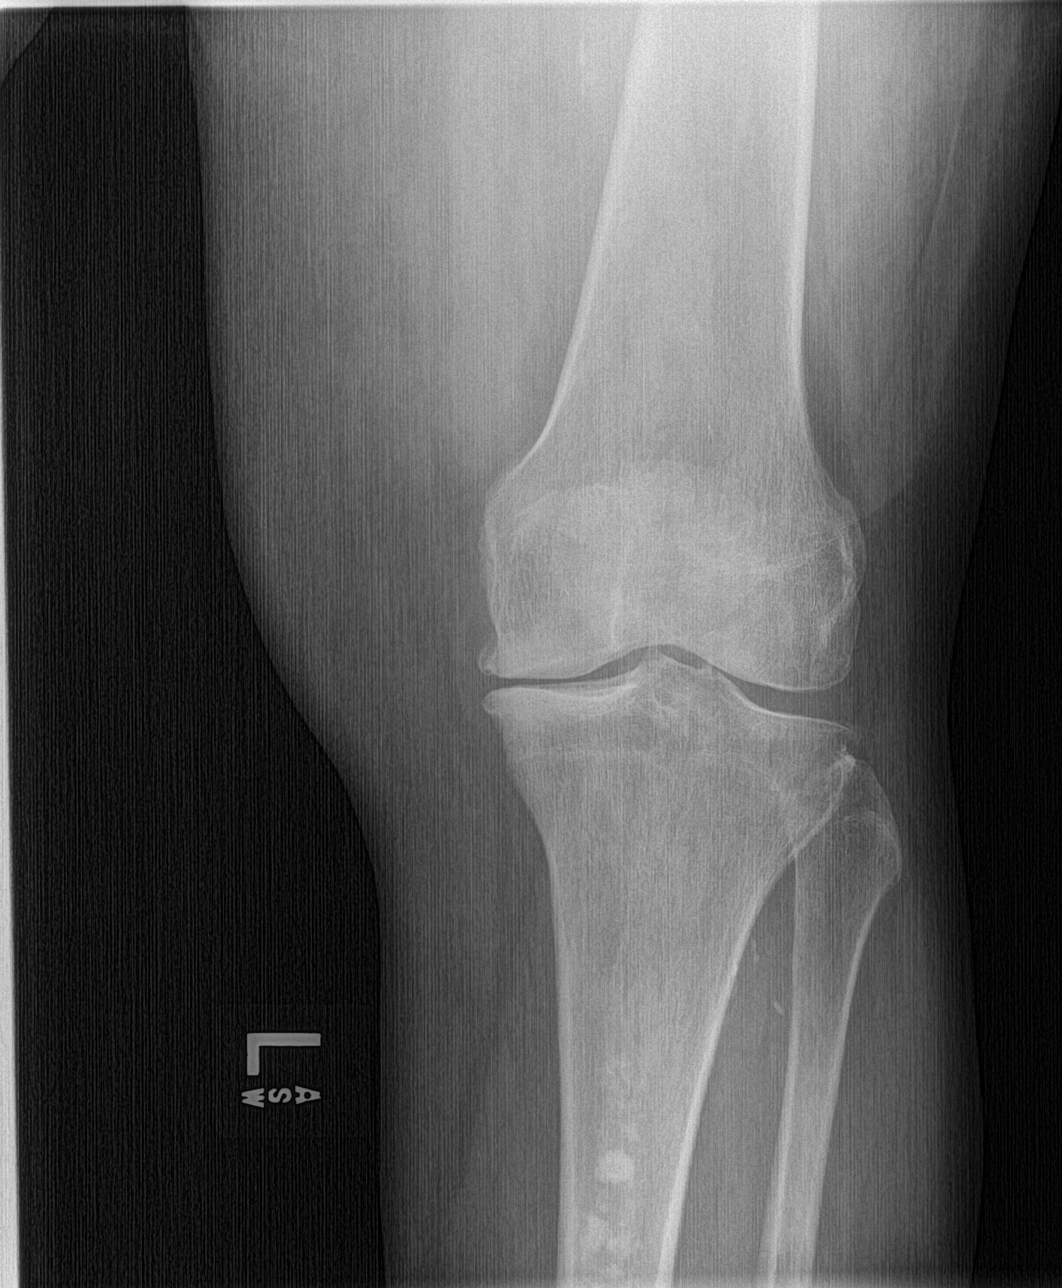

[knee ap]
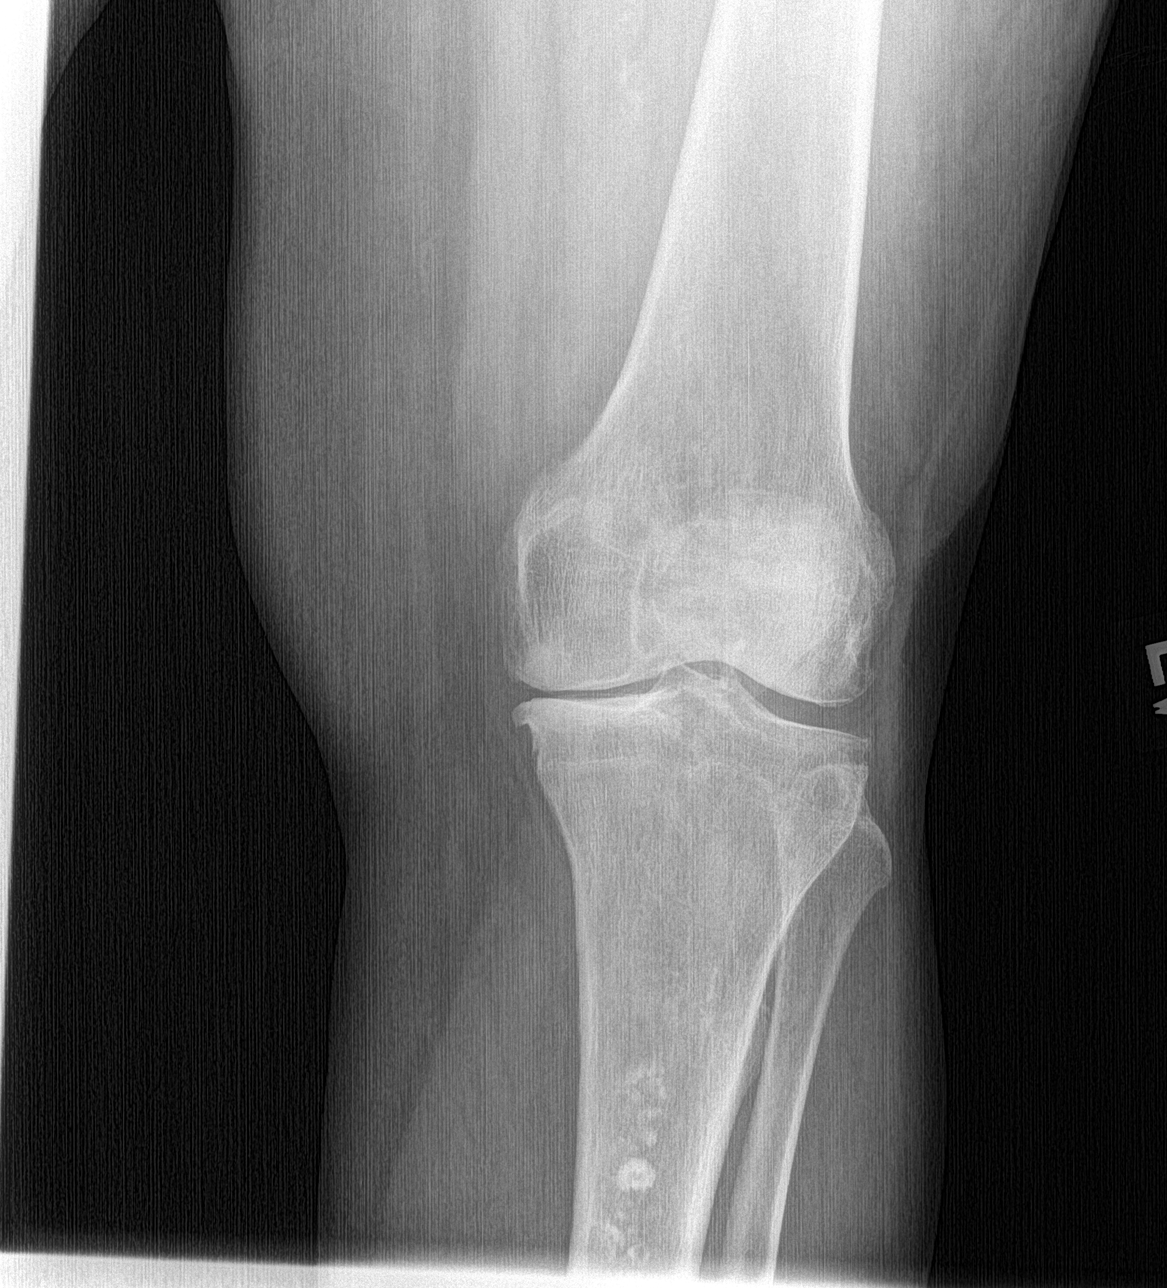

[knee obl (2 of 2)]
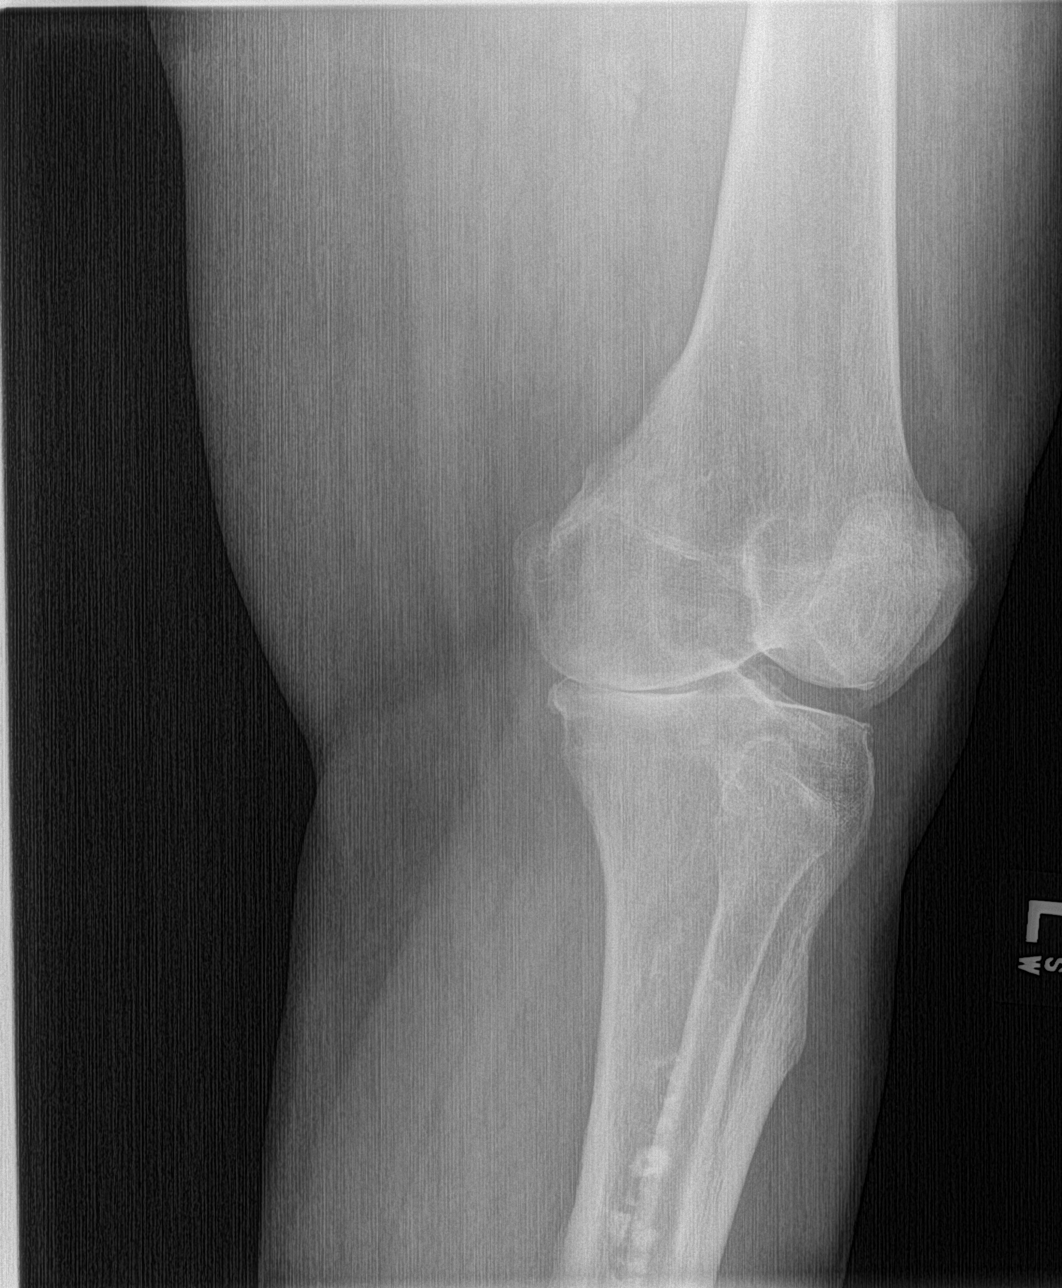

[4 of 4 positions shown; findings below may reference images not displayed]

FINDINGS: Osteopenia. No acute fracture or dislocation. Severe degenerative
changes of the medial compartment with joint space narrowing,
osteophyte formation and subchondral sclerosis. Mild degenerative
changes of the lateral and patellofemoral compartments.
Revisualization of multiple sclerotic foci of the visualized
proximal tibia, likely reflecting the sequela of remote prior
osseous infarct versus osteochondroma. Enthesopathic changes of the
quadriceps tendon insertion. Vascular calcifications. No unexpected
radiopaque foreign body.
IMPRESSION: Severe degenerative changes of the medial compartment.

## 2023-05-26 ENCOUNTER — Ambulatory Visit: Payer: PPO | Admitting: Emergency Medicine

## 2023-05-26 VITALS — Ht 69.0 in | Wt 249.0 lb

## 2023-05-26 DIAGNOSIS — Z0000 Encounter for general adult medical examination without abnormal findings: Secondary | ICD-10-CM | POA: Diagnosis not present

## 2023-05-26 NOTE — Patient Instructions (Addendum)
 Mr. Brandon Perez , Thank you for taking time to come for your Medicare Wellness Visit. I appreciate your ongoing commitment to your health goals. Please review the following plan we discussed and let me know if I can assist you in the future.   Referrals/Orders/Follow-Ups/Clinician Recommendations: Recommend a low dose lung CT scan to screen for lung cancer based on your smoking history. You declined today, but if you change your mind let us know.  This is a list of the screening recommended for you and due dates:  Health Maintenance  Topic Date Due   Screening for Lung Cancer  Never done   Colon Cancer Screening  02/22/2022   COVID-19 Vaccine (8 - 2024-25 season) 10/24/2022   Flu Shot  09/23/2023   Yearly kidney health urinalysis for diabetes  10/18/2023   Yearly kidney function blood test for diabetes  04/03/2024   Medicare Annual Wellness Visit  05/25/2024   Pneumonia Vaccine  Completed   Zoster (Shingles) Vaccine  Completed   HPV Vaccine  Aged Out   DTaP/Tdap/Td vaccine  Discontinued   Hepatitis C Screening  Discontinued    Advanced directives: (ACP Link)Information on Advanced Care Planning can be found at [facility] of [place] Advance Health Care Directives Advance Health Care Directives. [email or web address] You may also get these forms at your doctor's office.  Once you have completed the forms, please bring a copy of your health care power of attorney and living will to the office to be added to your chart at your convenience.   Next Medicare Annual Wellness Visit scheduled for next year: Yes, 06/07/24 @ 2:30pm (phone visit)

## 2023-05-26 NOTE — Progress Notes (Signed)
 Subjective:   Brandon Perez is a 68 y.o. who presents for a Medicare Wellness preventive visit.  Visit Complete: Virtual I connected with  Brandon Perez on 05/26/23 by a audio enabled telemedicine application and verified that I am speaking with the correct person using two identifiers.  Patient Location: Home  Provider Location: Home Office  I discussed the limitations of evaluation and management by telemedicine. The patient expressed understanding and agreed to proceed.  Vital Signs: Because this visit was a virtual/telehealth visit, some criteria may be missing or patient reported. Any vitals not documented were not able to be obtained and vitals that have been documented are patient reported.  VideoDeclined- This patient declined Librarian, academic. Therefore the visit was completed with audio only.  Persons Participating in Visit: Patient.  AWV Questionnaire: No: Patient Medicare AWV questionnaire was not completed prior to this visit.  Cardiac Risk Factors include: advanced age (>14men, >80 women);hypertension;dyslipidemia;obesity (BMI >30kg/m2);Other (see comment), Risk factor comments: prediabeteic     Objective:    Today's Vitals   05/26/23 1429  Weight: 249 lb (112.9 kg)  Height: 5\' 9"  (1.753 m)   Body mass index is 36.77 kg/m.     05/26/2023    2:40 PM 04/29/2022    8:49 AM 11/28/2019    2:26 PM  Advanced Directives  Does Patient Have a Medical Advance Directive? No No Yes  Type of Best boy of [place];Living will  Copy of Healthcare Power of Attorney in Chart?   No - copy requested  Would patient like information on creating a medical advance directive? Yes (MAU/Ambulatory/Procedural Areas - Information given) No - Patient declined     Current Medications (verified) Outpatient Encounter Medications as of 05/26/2023  Medication Sig   aspirin 81 MG tablet Take 81 mg by mouth daily.   atorvastatin (LIPITOR)  10 MG tablet Take 1 tablet (10 mg total) by mouth daily.   DULoxetine (CYMBALTA) 60 MG capsule Take 1 capsule (60 mg total) by mouth every evening.   lisinopril (ZESTRIL) 10 MG tablet Take 1 tablet (10 mg total) by mouth daily.   metFORMIN (GLUCOPHAGE) 500 MG tablet Take 1 tablet (500 mg total) by mouth daily with breakfast.   No facility-administered encounter medications on file as of 05/26/2023.    Allergies (verified) Penicillin g   History: Past Medical History:  Diagnosis Date   Arthritis    Hypertension    Past Surgical History:  Procedure Laterality Date   COLONOSCOPY  02/23/2012   normal   Family History  Problem Relation Age of Onset   Heart disease Mother    Heart disease Father    Cancer Sister    Hypertension Brother    Social History   Socioeconomic History   Marital status: Married    Spouse name: [name]   Number of children: 2   Years of education: Not on file   Highest education level: Not on file  Occupational History   Occupation: retired  Tobacco Use   Smoking status: Former    Current packs/day: 0.00    Average packs/day: 1 pack/day for 30.0 years (30.0 ttl pk-yrs)    Types: Cigarettes    Start date: 05/23/1989    Quit date: 05/24/2019    Years since quitting: 4.0   Smokeless tobacco: Never  Vaping Use   Vaping status: Never Used  Substance and Sexual Activity   Alcohol use: No    Alcohol/week: 0.0 standard drinks of  alcohol   Drug use: No   Sexual activity: Yes  Other Topics Concern   Not on file  Social History Narrative   Not on file   Social Drivers of Health   Financial Resource Strain: Low Risk  (05/26/2023)   Overall Financial Resource Strain (CARDIA)    Difficulty of Paying Living Expenses: Not hard at all  Food Insecurity: No Food Insecurity (05/26/2023)   Hunger Vital Sign    Worried About Running Out of Food in the Last Year: Never true    Ran Out of Food in the Last Year: Never true  Transportation Needs: No Transportation  Needs (05/26/2023)   PRAPARE - Administrator, Civil Service (Medical): No    Lack of Transportation (Non-Medical): No  Physical Activity: Inactive (05/26/2023)   Exercise Vital Sign    Days of Exercise per Week: 0 days    Minutes of Exercise per Session: 0 min  Stress: No Stress Concern Present (05/26/2023)   [employer] of Occupational Health - Occupational Stress Questionnaire    Feeling of Stress : Not at all  Social Connections: Moderately Isolated (05/26/2023)   Social Connection and Isolation Panel [NHANES]    Frequency of Communication with Friends and Family: More than three times a week    Frequency of Social Gatherings with Friends and Family: More than three times a week    Attends Religious Services: Never    Database administrator or Organizations: No    Attends Engineer, structural: Never    Marital Status: Married    Tobacco Counseling Counseling given: Not Answered    Clinical Intake:  Pre-visit preparation completed: Yes  Pain : No/denies pain     BMI - recorded: 36.77 Nutritional Status: BMI > 30  Obese Nutritional Risks: None Diabetes: No (prediabetic)  Lab Results  Component Value Date   HGBA1C 7.0 (H) 04/04/2023   HGBA1C 7.0 (H) 10/18/2022   HGBA1C 7.0 (H) 04/16/2022     How often do you need to have someone help you when you read instructions, pamphlets, or other written materials from your doctor or pharmacy?: 1 - Never  Interpreter Needed?: No  Information entered by :: Tora Kindred, CMA   Activities of Daily Living     05/26/2023    2:32 PM  In your present state of health, do you have any difficulty performing the following activities:  Hearing? 1  Comment slight hearing loss  Vision? 0  Difficulty concentrating or making decisions? 0  Walking or climbing stairs? 0  Dressing or bathing? 0  Doing errands, shopping? 0  Preparing Food and eating ? N  Using the Toilet? N  In the past six months, have you  accidently leaked urine? N  Do you have problems with loss of bowel control? N  Managing your Medications? N  Managing your Finances? N  Housekeeping or managing your Housekeeping? N    Patient Care Team: Duanne Limerick, MD as PCP - General (Family Medicine) [facility], [facility] (Optometry) Jerrol Banana, MD as Consulting Physician (Family Medicine)  Indicate any recent Medical Services you may have received from other than [facility] providers in the past year (date may be approximate).     Assessment:   This is a routine wellness examination for Brandon Perez.  Hearing/Vision screen Hearing Screening - Comments:: Denies hearing loss Vision Screening - Comments:: Gets eye exams, [facility], [place]   Goals Addressed  This Visit's Progress    Patient Stated       Lose 40 lbs       Depression Screen     05/26/2023    2:37 PM 04/18/2023    2:37 PM 10/15/2022    9:28 AM 04/29/2022    8:47 AM 04/16/2022    9:52 AM 12/14/2021    9:09 AM 08/19/2021    8:36 AM  PHQ 2/9 Scores  PHQ - 2 Score 0 0 0 0 0 0 0  PHQ- 9 Score 0  0 0 0 0 0    Fall Risk     05/26/2023    2:42 PM 05/02/2023    8:17 AM 04/18/2023    2:37 PM 03/03/2023   10:58 AM 10/15/2022    9:28 AM  Fall Risk   Falls in the past year? 0 0 0 0 0  Number falls in past yr: 0 0 0 0 0  Injury with Fall? 0  0 0 0  Risk for fall due to : No Fall Risks No Fall Risks No Fall Risks No Fall Risks No Fall Risks  Follow up Falls prevention discussed;Falls evaluation completed Falls evaluation completed Falls evaluation completed Falls evaluation completed Falls evaluation completed    MEDICARE RISK AT HOME:  Medicare Risk at Home Any stairs in or around the home?: Yes If so, are there any without handrails?: No Home free of loose throw rugs in walkways, pet beds, electrical cords, etc?: Yes Adequate lighting in your home to reduce risk of falls?: Yes Life alert?: No Use of a cane, walker or w/c?: No Grab bars in  the bathroom?: Yes Shower chair or bench in shower?: No Elevated toilet seat or a handicapped toilet?: Yes  TIMED UP AND GO:  Was the test performed?  No  Cognitive Function: 6CIT completed        05/26/2023    2:43 PM 04/29/2022    8:55 AM 11/28/2019    2:28 PM  6CIT Screen  What Year? 0 points 0 points 0 points  What month? 0 points 0 points 0 points  What time? 0 points 0 points 0 points  Count back from 20 0 points 0 points 0 points  Months in reverse 0 points 0 points 0 points  Repeat phrase 0 points 0 points 2 points  Total Score 0 points 0 points 2 points    Immunizations Immunization History  Administered Date(s) Administered   Fluad Quad(high Dose 65+) 03/14/2019, 12/02/2020, 12/14/2021   Fluad Trivalent(High Dose 65+) 11/12/2022   Moderna SARS-COV2 Booster Vaccination 12/25/2019, 06/17/2020   Moderna Sars-Covid-2 Vaccination 04/09/2019, 05/04/2019, 12/25/2019, 06/17/2020, 02/19/2022   PNEUMOCOCCAL CONJUGATE-20 06/16/2021   Respiratory Syncytial Virus Vaccine,Recomb Aduvanted(Arexvy) 02/19/2022   Td 08/17/2021   Tdap 08/17/2021   Zoster Recombinant(Shingrix) 06/16/2021, 08/17/2021    Screening Tests Health Maintenance  Topic Date Due   Lung Cancer Screening  Never done   Colonoscopy  02/22/2022   COVID-19 Vaccine (8 - 2024-25 season) 10/24/2022   INFLUENZA VACCINE  09/23/2023   Diabetic kidney evaluation - Urine ACR  10/18/2023   Diabetic kidney evaluation - eGFR measurement  04/03/2024   Medicare Annual Wellness (AWV)  05/25/2024   Pneumonia Vaccine 88+ Years old  Completed   Zoster Vaccines- Shingrix  Completed   HPV VACCINES  Aged Out   DTaP/Tdap/Td  Discontinued   Hepatitis C Screening  Discontinued    Health Maintenance  Health Maintenance Due  Topic Date Due   Lung Cancer  Screening  Never done   Colonoscopy  02/22/2022   COVID-19 Vaccine (8 - 2024-25 season) 10/24/2022   Health Maintenance Items Addressed: See Nurse Notes  Additional  Screening:  Vision Screening: Recommended annual ophthalmology exams for early detection of glaucoma and other disorders of the eye.  Dental Screening: Recommended annual dental exams for proper oral hygiene  Community Resource Referral / Chronic Care Management: CRR required this visit?  No   CCM required this visit?  No     Plan:     I have personally reviewed and noted the following in the patient's chart:   Medical and social history Use of alcohol, tobacco or illicit drugs  Current medications and supplements including opioid prescriptions. Patient is not currently taking opioid prescriptions. Functional ability and status Nutritional status Physical activity Advanced directives List of other physicians Hospitalizations, surgeries, and ER visits in previous 12 months Vitals Screenings to include cognitive, depression, and falls Referrals and appointments  In addition, I have reviewed and discussed with patient certain preventive protocols, quality metrics, and best practice recommendations. A written personalized care plan for preventive services as well as general preventive health recommendations were provided to patient.     Tora Kindred, CMA   05/26/2023   After Visit Summary: (MyChart) Due to this being a telephonic visit, the after visit summary with patients personalized plan was offered to patient via MyChart   Notes:  Declined DM & Nutrition education Declined LDCT Declined colonoscopy, but states he completed cologuard kit 04/2023. I did not see results.

## 2023-06-17 ENCOUNTER — Ambulatory Visit: Admitting: Urology

## 2023-07-01 ENCOUNTER — Ambulatory Visit: Admitting: Urology

## 2023-07-01 ENCOUNTER — Other Ambulatory Visit: Payer: Self-pay

## 2023-07-01 ENCOUNTER — Other Ambulatory Visit
Admission: RE | Admit: 2023-07-01 | Discharge: 2023-07-01 | Disposition: A | Discharge status: Home / Self Care | Attending: Urology | Admitting: Urology

## 2023-07-01 ENCOUNTER — Encounter: Payer: Self-pay | Admitting: Urology

## 2023-07-01 VITALS — BP 130/84 | HR 110 | Ht 69.0 in | Wt 248.6 lb

## 2023-07-01 DIAGNOSIS — R300 Dysuria: Secondary | ICD-10-CM | POA: Diagnosis not present

## 2023-07-01 DIAGNOSIS — R3989 Other symptoms and signs involving the genitourinary system: Secondary | ICD-10-CM | POA: Diagnosis not present

## 2023-07-01 DIAGNOSIS — N3001 Acute cystitis with hematuria: Secondary | ICD-10-CM

## 2023-07-01 LAB — URINALYSIS, COMPLETE (UACMP) WITH MICROSCOPIC
Bilirubin Urine: NEGATIVE
Glucose, UA: NEGATIVE mg/dL
Hgb urine dipstick: NEGATIVE
Ketones, ur: NEGATIVE mg/dL
Nitrite: POSITIVE — AB
Specific Gravity, Urine: 1.015 (ref 1.005–1.030)
pH: 7 (ref 5.0–8.0)

## 2023-07-01 LAB — BLADDER SCAN AMB NON-IMAGING

## 2023-07-01 MED ORDER — CIPROFLOXACIN HCL 500 MG PO TABS: 500.0000 mg | ORAL_TABLET | Freq: Two times a day (BID) | ORAL | 0 refills | Status: AC

## 2023-07-01 MED ORDER — TAMSULOSIN HCL 0.4 MG PO CAPS
0.4000 mg | ORAL_CAPSULE | Freq: Every day | ORAL | 11 refills | Status: AC
Start: 2023-07-01 — End: ?

## 2023-07-01 NOTE — Progress Notes (Signed)
 Elfrieda Grise Plume,acting as a scribe for Brandon Gimenez, MD.,have documented all relevant documentation on the behalf of Brandon Gimenez, MD,as directed by  Brandon Gimenez, MD while in the presence of Brandon Gimenez, MD.  07/01/23 2:37 PM   Monda Angry May 31, 1955 [id number]  Referring provider: Clarise Crooks, MD [id number] [place],  [place] [address]  Chief Complaint  Patient presents with   Dysuria    HPI: 68 year old male with a personal history of dysuria presents today for further evaluation. He was initial seen by his primary care in February for burning with urination, which had been ongoing for quite some time, associated with frequency and urgency, but no other urinary symptoms.  Initial urinalysis showed one-plus leukocytes, but was otherwise normal, and PSA was 1.0. Urine culture grew 100,000 colonies of Lactobacillus, and he was treated with a 10 course of Bactrim . Follow-up showed clear urine and negative culture, and he was treated with Diflucan  for possible fungal infection.   Currently, he reports urinating with pressure and needing to urinate multiple times in succession, with symptoms persisting for a couple of months. He notes a family history of similar symptoms in his brother.   Today's urinalysis is positive, indicating infection and inflammation.   He is concerned about his symptoms and is seeking treatment before leaving [place].  Results for orders placed or performed in visit on 07/01/23  Bladder Scan (Post Void Residual) in office  Result Value Ref Range   Scan Result   Results for orders placed or performed during the hospital encounter of 07/01/23  Urinalysis, Complete w Microscopic -  Result Value Ref Range   Color, Urine YELLOW YELLOW   APPearance HAZY (A) CLEAR   Specific Gravity, Urine 1.015 1.005 - 1.030   pH 7.0 5.0 - 8.0   Glucose, UA NEGATIVE NEGATIVE mg/dL   Hgb urine dipstick  NEGATIVE NEGATIVE   Bilirubin Urine NEGATIVE NEGATIVE   Ketones, ur NEGATIVE NEGATIVE mg/dL   Protein, ur TRACE (A) NEGATIVE mg/dL   Nitrite POSITIVE (A) NEGATIVE   Leukocytes,Ua MODERATE (A) NEGATIVE   Squamous Epithelial / HPF 0-5 0 - 5 /HPF   WBC, UA 21-50 0 - 5 WBC/hpf   RBC / HPF 6-10 0 - 5 RBC/hpf   Bacteria, UA MANY (A) NONE SEEN   Budding Yeast PRESENT       PMH: Past Medical History:  Diagnosis Date   Arthritis    Hypertension     Surgical History: Past Surgical History:  Procedure Laterality Date   COLONOSCOPY  02/23/2012   normal    Home Medications:  Allergies as of 07/01/2023       Reactions   Penicillin G Other (See Comments)   As a child        Medication List        Accurate as of Jul 01, 2023  2:37 PM. If you have any questions, ask your nurse or doctor.          aspirin 81 MG tablet Take 81 mg by mouth daily.   atorvastatin  10 MG tablet Commonly known as: LIPITOR Take 1 tablet (10 mg total) by mouth daily.   ciprofloxacin 500 MG tablet Commonly known as: CIPRO Take 1 tablet (500 mg total) by mouth every 12 (twelve) hours for 10 days. Started by: Brandon Perez   DULoxetine  60 MG capsule Commonly known as: Cymbalta  Take 1 capsule (60  mg total) by mouth every evening.   lisinopril  10 MG tablet Commonly known as: ZESTRIL  Take 1 tablet (10 mg total) by mouth daily.   metFORMIN  500 MG tablet Commonly known as: GLUCOPHAGE  Take 1 tablet (500 mg total) by mouth daily with breakfast.   tamsulosin 0.4 MG Caps capsule Commonly known as: FLOMAX Take 1 capsule (0.4 mg total) by mouth daily. Started by: Brandon Perez        Allergies:  Allergies  Allergen Reactions   Penicillin G Other (See Comments)    As a child    Family History: Family History  Problem Relation Age of Onset   Heart disease Mother    Heart disease Father    Cancer Sister    Hypertension Brother     Social History:  reports that he quit smoking  about 4 years ago. His smoking use included cigarettes. He started smoking about 34 years ago. He has a 30 pack-year smoking history. He has never used smokeless tobacco. He reports that he does not drink alcohol and does not use drugs.   Physical Exam: BP 130/84 (BP Location: Left Arm, Patient Position: Sitting, Cuff Size: Large)   Pulse (!) 110   Ht 5\' 9"  (1.753 m)   Wt 248 lb 9.6 oz (112.8 kg)   SpO2 95%   BMI 36.71 kg/m   Constitutional:  Alert and oriented, No acute distress. HEENT: [place] AT, moist mucus membranes.  Trachea midline, no masses. GU:  Decreased sphincter tone with large, nodular prostate, which was not tender Neurologic: Grossly intact, no focal deficits, moving all 4 extremities. Psychiatric: Normal mood and affect.   Assessment & Plan:    1. Acute cystitis - He presents with dysuria, frequency, and urgency, which have been ongoing for several months.  - Previous urinalysis showed one-plus leukocytes, and a urine culture grew Lactobacillus, treated with Bactrim .  - Current urinalysis is positive, indicating a possible UTI.  - Differential diagnosis includes acute cystitis and prostatitis.  - A rectal exam revealed a large, nodular prostate, which was not tender, suggesting BPH as a contributing factor.  - Plan to start Ciprofloxacin 500 mg BID for 10 days for suspected acute cystitis and Flomax to improve urinary flow.  - Follow-up in one month for repeat urinalysis, PVR, and possible cystoscopy if symptoms persist.  2. Incomplete bladder emptying - PVR is 319 mL, indicating incomplete bladder emptying, likely due to BPH.  - He is advised to monitor symptoms and seek emergency care if unable to urinate or if pain occurs, as catheterization may be necessary.  - Flomax is prescribed to aid in bladder emptying.  - Reassessment in one month to evaluate response to treatment and consider further intervention if needed.  Return in about 1 month (around 08/01/2023) for  IPSS/PVR/UA.  I have reviewed the above documentation for accuracy and completeness, and I agree with the above.   Brandon Gimenez, MD    [facility] [address] [place], [place] [address] [phone]

## 2023-07-04 LAB — URINE CULTURE: Culture: >=100000 — AB

## 2023-07-22 ENCOUNTER — Encounter: Payer: Self-pay | Admitting: Family Medicine

## 2023-07-22 ENCOUNTER — Ambulatory Visit: Payer: PPO | Admitting: Family Medicine

## 2023-07-22 ENCOUNTER — Ambulatory Visit (INDEPENDENT_AMBULATORY_CARE_PROVIDER_SITE_OTHER): Admitting: Family Medicine

## 2023-07-22 VITALS — BP 120/74 | HR 90 | Ht 69.0 in | Wt 245.0 lb

## 2023-07-22 DIAGNOSIS — Z7984 Long term (current) use of oral hypoglycemic drugs: Secondary | ICD-10-CM

## 2023-07-22 DIAGNOSIS — E782 Mixed hyperlipidemia: Secondary | ICD-10-CM

## 2023-07-22 DIAGNOSIS — E119 Type 2 diabetes mellitus without complications: Secondary | ICD-10-CM | POA: Diagnosis not present

## 2023-07-22 DIAGNOSIS — I10 Essential (primary) hypertension: Secondary | ICD-10-CM | POA: Diagnosis not present

## 2023-07-22 MED ORDER — ATORVASTATIN CALCIUM 10 MG PO TABS: 10.0000 mg | ORAL_TABLET | Freq: Every day | ORAL | 1 refills | Status: DC

## 2023-07-22 MED ORDER — METFORMIN HCL 500 MG PO TABS
500.0000 mg | ORAL_TABLET | Freq: Every day | ORAL | 1 refills | Status: DC
Start: 2023-07-22 — End: 2023-11-21

## 2023-07-22 MED ORDER — LISINOPRIL 10 MG PO TABS
10.0000 mg | ORAL_TABLET | Freq: Every day | ORAL | 1 refills | Status: DC
Start: 2023-07-22 — End: 2023-11-21

## 2023-07-22 NOTE — Progress Notes (Signed)
 Date:  07/22/2023   Name:  Brandon Perez   DOB:  December 15, 1955   MRN:  [id number]   Chief Complaint: Hypertension, Hyperlipidemia, and Diabetes  Hypertension This is a chronic problem. The current episode started more than 1 year ago. The problem has been waxing and waning since onset. The problem is controlled. Pertinent negatives include no anxiety, blurred vision, chest pain, headaches, malaise/fatigue, neck pain, orthopnea, palpitations, peripheral edema, PND, shortness of breath or sweats. There are no associated agents to hypertension. There are no known risk factors for coronary artery disease. Past treatments include ACE inhibitors. The current treatment provides moderate improvement. There are no compliance problems.  There is no history of CAD/MI or CVA. There is no history of chronic renal disease, a hypertension causing med or renovascular disease.  Hyperlipidemia This is a chronic problem. The current episode started more than 1 year ago. He has no history of chronic renal disease, diabetes, hypothyroidism, liver disease, obesity or nephrotic syndrome. There are no known factors aggravating his hyperlipidemia. Pertinent negatives include no chest pain or shortness of breath. He is currently on no antihyperlipidemic treatment. The current treatment provides moderate improvement of lipids. There are no compliance problems.   Diabetes He presents for his follow-up diabetic visit. He has type 2 diabetes mellitus. His disease course has been stable. Pertinent negatives for hypoglycemia include no headaches, hunger, mood changes, nervousness/anxiousness, seizures, speech difficulty or sweats. Pertinent negatives for diabetes include no blurred vision, no chest pain, no fatigue, no foot paresthesias, no foot ulcerations, no polydipsia, no polyphagia, no polyuria, no visual change, no weakness and no weight loss. There are no hypoglycemic complications. Symptoms are stable. There are no diabetic  complications. Pertinent negatives for diabetic complications include no CVA. Current diabetic treatment includes oral agent (monotherapy). His weight is stable. He is following a generally healthy diet. Meal planning includes avoidance of concentrated sweets and carbohydrate counting. An ACE inhibitor/angiotensin II receptor blocker is being taken.    Lab Results  Component Value Date   NA 136 04/04/2023   K 4.4 04/04/2023   CO2 21 04/04/2023   GLUCOSE 99 04/04/2023   BUN 12 04/04/2023   CREATININE 1.13 04/04/2023   CALCIUM  9.0 04/04/2023   EGFR 70 04/04/2023   GFRNONAA 69 03/14/2019   Lab Results  Component Value Date   CHOL 149 04/04/2023   HDL 37 (L) 04/04/2023   LDLCALC 87 04/04/2023   TRIG 144 04/04/2023   No results found for: "TSH" Lab Results  Component Value Date   HGBA1C 7.0 (H) 04/04/2023   No results found for: "WBC", "HGB", "HCT", "MCV", "PLT" Lab Results  Component Value Date   ALT 16 04/04/2023   AST 17 04/04/2023   ALKPHOS 85 04/04/2023   BILITOT 0.3 04/04/2023   No results found for: "25OHVITD2", "25OHVITD3", "VD25OH"   Review of Systems  Constitutional:  Negative for fatigue, malaise/fatigue and weight loss.  HENT:  Negative for postnasal drip.   Eyes:  Negative for blurred vision, photophobia and visual disturbance.  Respiratory:  Negative for apnea, cough, choking, chest tightness, shortness of breath, wheezing and stridor.   Cardiovascular:  Negative for chest pain, palpitations, orthopnea, leg swelling and PND.  Gastrointestinal:  Negative for abdominal distention and abdominal pain.  Endocrine: Negative for polydipsia, polyphagia and polyuria.  Musculoskeletal:  Negative for neck pain.  Neurological:  Negative for seizures, speech difficulty, weakness and headaches.  Psychiatric/Behavioral:  The patient is not nervous/anxious.     Patient  Active Problem List   Diagnosis Date Noted   Achilles tendon tear, left, initial encounter 11/09/2021    Primary osteoarthritis of left knee 05/14/2021   Primary osteoarthritis of right knee 05/14/2021   Calcific Achilles tendinitis of left lower extremity 05/14/2021    Allergies  Allergen Reactions   Penicillin G Other (See Comments)    As a child    Past Surgical History:  Procedure Laterality Date   COLONOSCOPY  02/23/2012   normal    Social History   Tobacco Use   Smoking status: Former    Current packs/day: 0.00    Average packs/day: 1 pack/day for 30.0 years (30.0 ttl pk-yrs)    Types: Cigarettes    Start date: 05/23/1989    Quit date: 05/24/2019    Years since quitting: 4.1   Smokeless tobacco: Never  Vaping Use   Vaping status: Never Used  Substance Use Topics   Alcohol use: No    Alcohol/week: 0.0 standard drinks of alcohol   Drug use: No     Medication list has been reviewed and updated.  Current Meds  Medication Sig   aspirin 81 MG tablet Take 81 mg by mouth daily.   atorvastatin  (LIPITOR) 10 MG tablet Take 1 tablet (10 mg total) by mouth daily.   DULoxetine  (CYMBALTA ) 60 MG capsule Take 1 capsule (60 mg total) by mouth every evening.   lisinopril  (ZESTRIL ) 10 MG tablet Take 1 tablet (10 mg total) by mouth daily.   metFORMIN  (GLUCOPHAGE ) 500 MG tablet Take 1 tablet (500 mg total) by mouth daily with breakfast.   tamsulosin  (FLOMAX ) 0.4 MG CAPS capsule Take 1 capsule (0.4 mg total) by mouth daily.       04/18/2023    2:37 PM 10/15/2022    9:28 AM 04/16/2022    9:52 AM 12/14/2021    9:09 AM  GAD 7 : Generalized Anxiety Score  Nervous, Anxious, on Edge 0 0 0 0  Control/stop worrying 0 0 0 0  Worry too much - different things 0 0 0 0  Trouble relaxing 0 0 0 0  Restless 0 0 0 0  Easily annoyed or irritable 0 0 0 0  Afraid - awful might happen 0 0 0 0  Total GAD 7 Score 0 0 0 0  Anxiety Difficulty Not difficult at all Not difficult at all Not difficult at all Not difficult at all       05/26/2023    2:37 PM 04/18/2023    2:37 PM 10/15/2022    9:28 AM   Depression screen PHQ 2/9  Decreased Interest 0 0 0  Down, Depressed, Hopeless 0 0 0  PHQ - 2 Score 0 0 0  Altered sleeping 0  0  Tired, decreased energy 0  0  Change in appetite 0  0  Feeling bad or failure about yourself  0  0  Trouble concentrating 0  0  Moving slowly or fidgety/restless 0  0  Suicidal thoughts 0  0  PHQ-9 Score 0  0  Difficult doing work/chores Not difficult at all  Not difficult at all    BP Readings from Last 3 Encounters:  07/22/23 120/74  07/01/23 130/84  05/02/23 100/76    Physical Exam Vitals and nursing note reviewed.  Constitutional:      Appearance: He is well-developed.  HENT:     Head: Normocephalic and atraumatic.     Right Ear: Tympanic membrane, ear canal and external ear normal.  Left Ear: Tympanic membrane, ear canal and external ear normal.     Nose: Nose normal.     Mouth/Throat:     Mouth: Mucous membranes are moist.     Dentition: Normal dentition.     Pharynx: Oropharynx is clear.  Eyes:     General: Lids are normal. No scleral icterus.    Conjunctiva/sclera: Conjunctivae normal.     Pupils: Pupils are equal, round, and reactive to light.  Neck:     Thyroid: No thyromegaly.     Vascular: No carotid bruit, hepatojugular reflux or JVD.     Trachea: No tracheal deviation.  Cardiovascular:     Rate and Rhythm: Normal rate and regular rhythm.     Heart sounds: Normal heart sounds. No murmur heard.    No gallop.  Pulmonary:     Effort: Pulmonary effort is normal. No respiratory distress.     Breath sounds: Normal breath sounds. No stridor. No wheezing, rhonchi or rales.  Chest:     Chest wall: No tenderness.  Abdominal:     General: Bowel sounds are normal.     Palpations: Abdomen is soft. There is no hepatomegaly, splenomegaly or mass.     Tenderness: There is no abdominal tenderness.     Hernia: There is no hernia in the left inguinal area.  Musculoskeletal:        General: Normal range of motion.     Cervical back:  Normal range of motion and neck supple.  Lymphadenopathy:     Cervical: No cervical adenopathy.  Skin:    General: Skin is warm and dry.     Findings: No rash.  Neurological:     Mental Status: He is alert and oriented to person, place, and time.     Sensory: No sensory deficit.     Deep Tendon Reflexes: Reflexes are normal and symmetric.  Psychiatric:        Mood and Affect: Mood is not anxious or depressed.     Wt Readings from Last 3 Encounters:  07/22/23 245 lb (111.1 kg)  07/01/23 248 lb 9.6 oz (112.8 kg)  05/26/23 249 lb (112.9 kg)    BP 120/74   Pulse 90   Ht 5\' 9"  (1.753 m)   Wt 245 lb (111.1 kg)   SpO2 97%   BMI 36.18 kg/m   Assessment and Plan: 1. Essential hypertension (Primary) Chronic.  Controlled.  Stable.  Asymptomatic.  Tolerating lisinopril  10 mg once a day.  Will continue at current dosing and review of previous labs are acceptable and will be done in 4 months. - lisinopril  (ZESTRIL ) 10 MG tablet; Take 1 tablet (10 mg total) by mouth daily.  Dispense: 90 tablet; Refill: 1  2. Diabetes mellitus treated with oral medication (HCC) Chronic.  Controlled.  Stable.  Asymptomatic.  Tolerating metformin  500 mg once a day.  Will continue current dose and will recheck A1c in 4 months. - metFORMIN  (GLUCOPHAGE ) 500 MG tablet; Take 1 tablet (500 mg total) by mouth daily with breakfast.  Dispense: 90 tablet; Refill: 1  3. Mixed hyperlipidemia Chronic.  Controlled.  Stable.  Tolerating atorvastatin  10 mg once a day and will continue at current dosing and will recheck lipid panel in 4 months.  For - atorvastatin  (LIPITOR) 10 MG tablet; Take 1 tablet (10 mg total) by mouth daily.  Dispense: 90 tablet; Refill: 1     Alayne Allis, MD

## 2023-07-26 ENCOUNTER — Other Ambulatory Visit: Payer: Self-pay | Admitting: Family Medicine

## 2023-07-26 DIAGNOSIS — M1712 Unilateral primary osteoarthritis, left knee: Secondary | ICD-10-CM

## 2023-07-26 DIAGNOSIS — M1711 Unilateral primary osteoarthritis, right knee: Secondary | ICD-10-CM

## 2023-07-27 NOTE — Telephone Encounter (Signed)
 Requested Prescriptions  Pending Prescriptions Disp Refills   DULoxetine  (CYMBALTA ) 60 MG capsule [Pharmacy Med Name: DULoxetine  HCl 60 MG Oral Capsule Delayed Release Particles] 90 capsule 0    Sig: TAKE 1 CAPSULE BY MOUTH IN THE EVENING     Psychiatry: Antidepressants - SNRI - duloxetine  Passed - 07/27/2023 12:12 PM      Passed - Cr in normal range and within 360 days    Creatinine, Ser  Date Value Ref Range Status  04/04/2023 1.13 0.76 - 1.27 mg/dL Final         Passed - eGFR is 30 or above and within 360 days    GFR calc Af Amer  Date Value Ref Range Status  03/14/2019 80 >59 mL/min/1.73 Final   GFR calc non Af Amer  Date Value Ref Range Status  03/14/2019 69 >59 mL/min/1.73 Final   eGFR  Date Value Ref Range Status  04/04/2023 70 >59 mL/min/1.73 Final         Passed - Completed PHQ-2 or PHQ-9 in the last 360 days      Passed - Last BP in normal range    BP Readings from Last 1 Encounters:  07/22/23 120/74         Passed - Valid encounter within last 6 months    Recent Outpatient Visits           5 days ago Essential hypertension   [facility] Kayla Part, MD   2 months ago Primary osteoarthritis of right knee   [facility] Dash, Dessie Flow, MD   3 months ago Dysuria   [facility] Kayla Part, MD   3 months ago Dysuria   [facility] Kayla Part, MD       Future Appointments             In 5 days McGowan, Shannon A, PA-C [facility] [place]   In 6 days Augustus Ledger, Dessie Flow, MD [facility]

## 2023-07-31 NOTE — Progress Notes (Unsigned)
 08/01/2023 9:22 AM   Brandon Perez [birth year] [id number]  Referring provider: Clarise Crooks, MD No address on file  Urological history: 1. BPH with LU TS - PSA (03/2023) 1.0 - tamsulosin  0.4 mg daily  2. Incomplete bladder emptying - tamsulosin  0.4 mg daily  3. UTI  - May 9th, 2025 staphylococcus epidermidis and streptococcus mitis/oralis  Chief Complaint  Patient presents with   Benign Prostatic Hypertrophy   HPI: Brandon Perez is a 68 y.o. man who presents today for one month follow up.  Previous records reviewed.   He was seen by Dr. Ace Holder on 07/01/2023 for complaints of dysuria, frequency and urgency.  He was found to have BPH on DRE and incomplete bladder emptying.  He was given Cipro  and tamsulosin .  Urine culture grew out strep and staph species.    I PSS 3/1  PVR 0 mL   UA yellow clear, specific gravity 1.020, pH 5.5, trace ketone, 0-5 squames and rare bacteria.  He states all his symptoms have abated and he is voiding well.  Patient denies any modifying or aggravating factors.  Patient denies any recent UTI's, gross hematuria, dysuria or suprapubic/flank pain.  Patient denies any fevers, chills, nausea or vomiting.     IPSS     Row Name 08/01/23 0800         International Prostate Symptom Score   How often have you had the sensation of not emptying your bladder? Not at All     How often have you had to urinate less than every two hours? Not at All     How often have you found you stopped and started again several times when you urinated? Not at All     How often have you found it difficult to postpone urination? Not at All     How often have you had a weak urinary stream? Not at All     How often have you had to strain to start urination? Not at All     How many times did you typically get up at night to urinate? 3 Times     Total IPSS Score 3       Quality of Life due to urinary symptoms   If you were to spend the rest of your life with your  urinary condition just the way it is now how would you feel about that? Pleased              Score:  1-7 Mild 8-19 Moderate 20-35 Severe   PMH: Past Medical History:  Diagnosis Date   Arthritis    Hypertension     Surgical History: Past Surgical History:  Procedure Laterality Date   COLONOSCOPY  02/23/2012   normal    Home Medications:  Allergies as of 08/01/2023       Reactions   Penicillin G Other (See Comments)   As a child        Medication List        Accurate as of August 01, 2023  9:22 AM. If you have any questions, ask your nurse or doctor.          aspirin 81 MG tablet Take 81 mg by mouth daily.   atorvastatin  10 MG tablet Commonly known as: LIPITOR Take 1 tablet (10 mg total) by mouth daily.   DULoxetine  60 MG capsule Commonly known as: CYMBALTA  TAKE 1 CAPSULE BY MOUTH IN THE EVENING   lisinopril  10 MG tablet Commonly known as:  ZESTRIL  Take 1 tablet (10 mg total) by mouth daily.   metFORMIN  500 MG tablet Commonly known as: GLUCOPHAGE  Take 1 tablet (500 mg total) by mouth daily with breakfast.   tamsulosin  0.4 MG Caps capsule Commonly known as: FLOMAX  Take 1 capsule (0.4 mg total) by mouth daily.        Allergies:  Allergies  Allergen Reactions   Penicillin G Other (See Comments)    As a child    Family History: Family History  Problem Relation Age of Onset   Heart disease Mother    Heart disease Father    Cancer Sister    Hypertension Brother     Social History:  reports that he quit smoking about 4 years ago. His smoking use included cigarettes. He started smoking about 34 years ago. He has a 30 pack-year smoking history. He has never used smokeless tobacco. He reports that he does not drink alcohol and does not use drugs.  ROS: Pertinent ROS in HPI  Physical Exam: BP 135/85 (BP Location: Left Arm, Patient Position: Sitting, Cuff Size: Large)   Pulse (!) 111   SpO2 96%   Constitutional:  Well nourished. Alert and  oriented, No acute distress. HEENT: [place] AT, moist mucus membranes.  Trachea midline Cardiovascular: No clubbing, cyanosis, or edema. Respiratory: Normal respiratory effort, no increased work of breathing. Neurologic: Grossly intact, no focal deficits, moving all 4 extremities. Psychiatric: Normal mood and affect.  Laboratory Data: Lab Results  Component Value Date   CREATININE 1.13 04/04/2023    Lab Results  Component Value Date   HGBA1C 7.0 (H) 04/04/2023       Component Value Date/Time   CHOL 149 04/04/2023 1213   HDL 37 (L) 04/04/2023 1213   LDLCALC 87 04/04/2023 1213    Lab Results  Component Value Date   AST 17 04/04/2023   Lab Results  Component Value Date   ALT 16 04/04/2023    Urinalysis See EPIC and HPI  I have reviewed the labs.   Pertinent Imaging:  08/01/23 08:47  Scan Result 0mL    Assessment & Plan:    1. BPH with LUTS -PSA stable  -DRE benign  -UA benign  -PVR < 300 cc  -continue conservative management, avoiding bladder irritants and timed voiding's --Continue tamsulosin  0.4 mg daily  2. Incomplete bladder emptying -PVR demonstrates adequate emptying.  3. Dysuria - secondary to UTI - UA benign - Symptoms abated  Return in about 1 year (around 07/31/2024) for PSA, I PSS, PVR .  These notes generated with voice recognition software. I apologize for typographical errors.  Briant Camper  [facility] [address]  [address] [place], [place] [address] [phone]

## 2023-08-01 ENCOUNTER — Other Ambulatory Visit
Admission: RE | Admit: 2023-08-01 | Discharge: 2023-08-01 | Disposition: A | Discharge status: Home / Self Care | Attending: Urology | Admitting: Urology

## 2023-08-01 ENCOUNTER — Ambulatory Visit (INDEPENDENT_AMBULATORY_CARE_PROVIDER_SITE_OTHER): Admitting: Urology

## 2023-08-01 ENCOUNTER — Encounter: Payer: Self-pay | Admitting: Urology

## 2023-08-01 VITALS — BP 135/85 | HR 111

## 2023-08-01 DIAGNOSIS — N3001 Acute cystitis with hematuria: Secondary | ICD-10-CM | POA: Diagnosis not present

## 2023-08-01 DIAGNOSIS — R3989 Other symptoms and signs involving the genitourinary system: Secondary | ICD-10-CM

## 2023-08-01 DIAGNOSIS — R339 Retention of urine, unspecified: Secondary | ICD-10-CM

## 2023-08-01 DIAGNOSIS — R300 Dysuria: Secondary | ICD-10-CM

## 2023-08-01 DIAGNOSIS — N138 Other obstructive and reflux uropathy: Secondary | ICD-10-CM | POA: Diagnosis not present

## 2023-08-01 DIAGNOSIS — N401 Enlarged prostate with lower urinary tract symptoms: Secondary | ICD-10-CM | POA: Diagnosis not present

## 2023-08-01 LAB — URINALYSIS, COMPLETE (UACMP) WITH MICROSCOPIC
Bilirubin Urine: NEGATIVE
Glucose, UA: NEGATIVE mg/dL
Hgb urine dipstick: NEGATIVE
Leukocytes,Ua: NEGATIVE
Nitrite: NEGATIVE
Protein, ur: NEGATIVE mg/dL
RBC / HPF: NONE SEEN RBC/hpf (ref 0–5)
Specific Gravity, Urine: 1.02 (ref 1.005–1.030)
WBC, UA: NONE SEEN WBC/hpf (ref 0–5)
pH: 5.5 (ref 5.0–8.0)

## 2023-08-01 LAB — BLADDER SCAN AMB NON-IMAGING

## 2023-08-02 ENCOUNTER — Ambulatory Visit (INDEPENDENT_AMBULATORY_CARE_PROVIDER_SITE_OTHER): Admitting: Family Medicine

## 2023-08-02 ENCOUNTER — Telehealth: Payer: Self-pay | Admitting: Family Medicine

## 2023-08-02 ENCOUNTER — Other Ambulatory Visit (INDEPENDENT_AMBULATORY_CARE_PROVIDER_SITE_OTHER): Payer: Self-pay | Admitting: Radiology

## 2023-08-02 ENCOUNTER — Other Ambulatory Visit: Payer: Self-pay

## 2023-08-02 ENCOUNTER — Encounter: Payer: Self-pay | Admitting: Family Medicine

## 2023-08-02 ENCOUNTER — Telehealth: Payer: Self-pay

## 2023-08-02 VITALS — BP 108/70 | HR 104 | Ht 69.0 in | Wt 245.0 lb

## 2023-08-02 DIAGNOSIS — M1712 Unilateral primary osteoarthritis, left knee: Secondary | ICD-10-CM

## 2023-08-02 DIAGNOSIS — M1711 Unilateral primary osteoarthritis, right knee: Secondary | ICD-10-CM

## 2023-08-02 MED ORDER — TRIAMCINOLONE ACETONIDE 40 MG/ML IJ SUSP
80.0000 mg | Freq: Once | INTRAMUSCULAR | Status: AC
  Administered 2023-08-02: 80 mg via INTRA_ARTICULAR

## 2023-08-02 MED ORDER — OXYCODONE-ACETAMINOPHEN 2.5-325 MG PO TABS: 1.0000 | ORAL_TABLET | ORAL | 0 refills | Status: DC | PRN

## 2023-08-02 NOTE — Progress Notes (Signed)
 Primary Care / Sports Medicine Office Visit  Patient Information:  Patient ID: Brandon Perez, male DOB: 1955-09-07 Age: 68 y.o. MRN: [id number]   Brandon Perez is a pleasant 68 y.o. male presenting with the following:  Chief Complaint  Patient presents with   Knee Pain    Bil knee pain. Flare up started in May. Patient is not taking anything for pain. Left is worse than the right. He is requesting a cortisone injection for both knees today.    Vitals:   08/02/23 0801  BP: 108/70  Pulse: (!) 104  SpO2: 95%   Vitals:   08/02/23 0801  Weight: 245 lb (111.1 kg)  Height: 5\' 9"  (1.753 m)   Body mass index is 36.18 kg/m.  No results found.   Independent interpretation of notes and tests performed by another provider:   None  Procedures performed:   Procedure:  Injection of right knee under ultrasound guidance. Ultrasound guidance utilized for anterolateral approach, joint line visualized Samsung HS60 device utilized with permanent recording / reporting. Verbal informed consent obtained and verified. Skin prepped in a sterile fashion. Ethyl chloride for topical local analgesia.  Completed without difficulty and tolerated well. Medication: triamcinolone  acetonide 40 mg/mL suspension for injection 1 mL total and 2 mL lidocaine 1% without epinephrine utilized for needle placement anesthetic Advised to contact for fevers/chills, erythema, induration, drainage, or persistent bleeding.  Procedure:  Injection of left knee under ultrasound guidance. Ultrasound guidance utilized for anterolateral approach, no effusion noted Samsung HS60 device utilized with permanent recording / reporting. Verbal informed consent obtained and verified. Skin prepped in a sterile fashion. Ethyl chloride for topical local analgesia.  Completed without difficulty and tolerated well. Medication: triamcinolone  acetonide 40 mg/mL suspension for injection 1 mL total and 2 mL lidocaine 1% without  epinephrine utilized for needle placement anesthetic Advised to contact for fevers/chills, erythema, induration, drainage, or persistent bleeding.   Pertinent History, Exam, Impression, and Recommendations:   Problem List Items Addressed This Visit     Primary osteoarthritis of left knee   History of Present Illness Brandon Perez is a 68 year old male who presents for bilateral chronic pain from knee osteoarthritis.  He received corticosteroid injection to the left knee approximately three months ago, which provided relief for about two months. However, the pain has returned over the past month, particularly in the left knee, which he feels he is favoring the right knee for, causing increased discomfort.  He inquires about the possibility of obtaining pain relief medication if the pain becomes severe.  He plans to travel to [facility] for two weeks and expresses concern about managing his knee pain during this time. He mentions that he has an ice pack at home, which he uses to help manage the pain.  Assessment and Plan Bilateral knee osteoarthritis Chronic bilateral knee pain with recent exacerbation in the left more so than right knee. Discussed SAM device, PRP, and pain management pharmacotherapeutic options. SAM device preferred for non-invasive treatment. Oxycodone 2.5 mg prescribed with caution due to legal restrictions and side effects. - Administer cortisone injection to both knees under ultrasound guidance. - Prescribe a five-day course of oxycodone for acute pain management, to be used sparingly on an as-needed basis. - Initiate request for [facility] device for bilateral knee osteoarthritis pain.  Representative should contact you about coordinating this. - Advise use of ice packs for pain relief. - Schedule follow-up in three months, adjustable based on symptoms.  Relevant Medications   oxycodone-acetaminophen (PERCOCET) 2.5-325 MG tablet   Other Relevant Orders   US  LIMITED  JOINT SPACE STRUCTURES LOW BILAT   Primary osteoarthritis of right knee - Primary   See additional assessment(s) for plan details.      Relevant Medications   oxycodone-acetaminophen (PERCOCET) 2.5-325 MG tablet   Other Relevant Orders   US  LIMITED JOINT SPACE STRUCTURES LOW BILAT     Orders & Medications Medications:  Meds ordered this encounter  Medications   oxycodone-acetaminophen (PERCOCET) 2.5-325 MG tablet    Sig: Take 1-2 tablets by mouth every 4 (four) hours as needed for pain.    Dispense:  30 tablet    Refill:  0   Orders Placed This Encounter  Procedures   US  LIMITED JOINT SPACE STRUCTURES LOW BILAT     Return in about 3 months (around 11/02/2023).     Ma Saupe, MD, [facility]   Primary Care Sports Medicine Primary Care and Sports Medicine at [facility]

## 2023-08-02 NOTE — Patient Instructions (Signed)
 You have just been given a cortisone injection to reduce pain and inflammation. After the injection you may notice immediate relief of pain as a result of the Lidocaine. It is important to rest the area of the injection for 24 to 48 hours after the injection. There is a possibility of some temporary increased discomfort and swelling for up to 72 hours until the cortisone begins to work. If you do have pain, simply rest the joint and use ice. If you can tolerate over the counter medications, you can try Tylenol, Aleve, or Advil for added relief per package instructions.  Patient Action Plan  1. Knee Pain Management:    - Receive cortisone injections in both knees, guided by ultrasound.    - Use prescribed oxycodone (2.5 mg) for acute pain relief as needed. Use sparingly due to potential side effects.    - Expect contact from a SAM device representative to help manage knee pain non-invasively.    - Apply ice packs to your knees for pain relief as needed.  2. Travel Considerations:    - Plan to manage knee pain during your two-week trip to [facility] by bringing your ice pack and medications.  3. Follow-Up:    - Schedule a follow-up appointment in three months, or sooner if symptoms worsen.  Red Flags: If you experience severe pain, swelling, or any new symptoms in your knees, contact your healthcare provider immediately.

## 2023-08-02 NOTE — Telephone Encounter (Signed)
 I have completed form. Please sign so I can fax.  JM

## 2023-08-02 NOTE — Addendum Note (Signed)
 Addended by: Kalub Morillo on: 08/02/2023 08:41 AM   Modules accepted: Orders

## 2023-08-02 NOTE — Telephone Encounter (Signed)
 Please review.  KP  Copied from CRM [id number]. Topic: Clinical - Prescription Issue >> Aug 02, 2023  9:49 AM Baldemar Lev wrote: Reason for CRM: Dawn from [facility] called to report that they do not have the current Rx in stock. They have the 5-325 in stock not the 2.5-325.   oxycodone-acetaminophen (PERCOCET) 2.5-325 MG tablet [id number] [place], [place] - [address] [place], [place] [address] Phone: [phone]  Fax: [phone]

## 2023-08-02 NOTE — Telephone Encounter (Signed)
 Called pharmacy they want a new prescription sent in.  KP

## 2023-08-02 NOTE — Telephone Encounter (Signed)
 Copied from CRM [id number]. Topic: Clinical - Prescription Issue >> Aug 02, 2023  9:49 AM Baldemar Lev wrote: Reason for CRM: Dawn from [facility] called to report that they do not have the current Rx in stock. They have the 5-325 in stock not the 2.5-325.   oxycodone-acetaminophen (PERCOCET) 2.5-325 MG tablet [facility] - [place], [place] - [address] [place] [address] Phone: [phone]  Fax: [phone] >> Aug 02, 2023 10:05 AM Corin V wrote: Patient stated that the [facility] does not have the correct prescription for his percocet and he wanted it sent to the [facility] in [place], [place] since he will be there for 2 weeks.

## 2023-08-02 NOTE — Assessment & Plan Note (Signed)
 See additional assessment(s) for plan details.

## 2023-08-02 NOTE — Assessment & Plan Note (Signed)
 History of Present Illness Brandon Perez is a 68 year old male who presents for bilateral chronic pain from knee osteoarthritis.  He received corticosteroid injection to the left knee approximately three months ago, which provided relief for about two months. However, the pain has returned over the past month, particularly in the left knee, which he feels he is favoring the right knee for, causing increased discomfort.  He inquires about the possibility of obtaining pain relief medication if the pain becomes severe.  He plans to travel to [facility] for two weeks and expresses concern about managing his knee pain during this time. He mentions that he has an ice pack at home, which he uses to help manage the pain.  Assessment and Plan Bilateral knee osteoarthritis Chronic bilateral knee pain with recent exacerbation in the left more so than right knee. Discussed SAM device, PRP, and pain management pharmacotherapeutic options. SAM device preferred for non-invasive treatment. Oxycodone 2.5 mg prescribed with caution due to legal restrictions and side effects. - Administer cortisone injection to both knees under ultrasound guidance. - Prescribe a five-day course of oxycodone for acute pain management, to be used sparingly on an as-needed basis. - Initiate request for [facility] device for bilateral knee osteoarthritis pain.  Representative should contact you about coordinating this. - Advise use of ice packs for pain relief. - Schedule follow-up in three months, adjustable based on symptoms.

## 2023-08-03 ENCOUNTER — Other Ambulatory Visit: Payer: Self-pay | Admitting: Family Medicine

## 2023-08-03 ENCOUNTER — Telehealth: Payer: Self-pay

## 2023-08-03 MED ORDER — OXYCODONE-ACETAMINOPHEN 5-325 MG PO TABS: 1.0000 | ORAL_TABLET | ORAL | 0 refills | Status: AC | PRN

## 2023-08-03 NOTE — Telephone Encounter (Signed)
 Patient called back stated the script was sent to the wrong pharmacy as he is in [place]. He needs the medication oxycodone-acetaminophen (PERCOCET) 2.5-325 MG tablet transferred to  [facility] [address], [place] - [address] Phone: [phone]  Fax: [phone]

## 2023-08-03 NOTE — Telephone Encounter (Signed)
 Please decline. Duplicate  KP

## 2023-08-03 NOTE — Telephone Encounter (Signed)
 Spoke with patient and advised him to call pharmacy and have transferred to [facility].  JM

## 2023-08-03 NOTE — Telephone Encounter (Signed)
 Requested medication (s) are due for refill today: sent to different pharmacy  Requested medication (s) are on the active medication list: yes  Last refill:  08/02/23  Future visit scheduled: yes  Notes to clinic:  Unable to refill per protocol, cannot delegate. Patient wants medication sent to different pharmacy, please send to [facility] [address], [place] - [address]       Requested Prescriptions  Pending Prescriptions Disp Refills   oxycodone-acetaminophen (PERCOCET) 2.5-325 MG tablet 30 tablet 0    Sig: Take 1-2 tablets by mouth every 4 (four) hours as needed for pain.     Not Delegated - Analgesics:  Opioid Agonist Combinations Failed - 08/03/2023 11:24 AM      Failed - This refill cannot be delegated      Failed - Urine Drug Screen completed in last 360 days      Passed - Valid encounter within last 3 months    Recent Outpatient Visits           Yesterday Primary osteoarthritis of right knee   [place] [facility] Dash, Dessie Flow, MD   1 week ago Essential hypertension   [place] [facility] Kayla Part, MD   3 months ago Primary osteoarthritis of right knee   [facility] Dash, Dessie Flow, MD   3 months ago Dysuria   [facility]la Part, MD   4 months ago Dysuria   [facility] at [facility] Kayla Part, MD       Future Appointments             In 3 months Augustus Ledger, Dessie Flow, MD [facility] Primary Care & Sports Medicine at [facility], [facility]   In 1 year McGowan, Nyra Bellis [facility] [place]

## 2023-08-03 NOTE — Telephone Encounter (Signed)
 Copied from CRM [id number]. Topic: Clinical - Prescription Issue >> Aug 03, 2023 12:00 PM Santiya F wrote: Reason for CRM: Patient is calling in because his medication oxycodone-acetaminophen (PERCOCET) 2.5-325 MG tablet [[id number]] was sent to [facility] in [facility] and he needs it transferred to [facility] [address] [place] - [address]. Patient says he called this morning but nothing has been sent. Please advise.

## 2023-08-12 ENCOUNTER — Telehealth: Payer: Self-pay

## 2023-08-12 NOTE — Telephone Encounter (Signed)
 Spoke with patient about denial on SAM devise. I asked him if he wanted to try a custom OA knee brace. He said not at the moment. I told him to let us  know if he does want try  it.  JM

## 2023-11-01 ENCOUNTER — Other Ambulatory Visit: Payer: Self-pay | Admitting: Family Medicine

## 2023-11-01 DIAGNOSIS — M1712 Unilateral primary osteoarthritis, left knee: Secondary | ICD-10-CM

## 2023-11-01 DIAGNOSIS — M1711 Unilateral primary osteoarthritis, right knee: Secondary | ICD-10-CM

## 2023-11-01 NOTE — Telephone Encounter (Unsigned)
 Copied from CRM #[id number]. Topic: Clinical - Medication Refill >> Nov 01, 2023  8:05 AM Precious C wrote: Medication: DULoxetine  (CYMBALTA ) 60 MG capsule  Has the patient contacted their pharmacy? Yes, advised to contact provider (Agent: If no, request that the patient contact the pharmacy for the refill. If patient does not wish to contact the pharmacy document the reason why and proceed with request.) (Agent: If yes, when and what did the pharmacy advise?)  This is the patient's preferred pharmacy:    [facility] [address], [place] - [address] [place] [address] Phone: [phone] Fax: [phone]  Is this the correct pharmacy for this prescription? Yes If no, delete pharmacy and type the correct one.   Has the prescription been filled recently? No  Is the patient out of the medication? No, has 6 days left of Rx  Has the patient been seen for an appointment in the last year OR does the patient have an upcoming appointment? Yes  Can we respond through MyChart? Yes  Agent: Please be advised that Rx refills may take up to 3 business days. We ask that you follow-up with your pharmacy.

## 2023-11-02 MED ORDER — DULOXETINE HCL 60 MG PO CPEP: 60.0000 mg | ORAL_CAPSULE | Freq: Every day | ORAL | 0 refills | Status: DC

## 2023-11-02 NOTE — Telephone Encounter (Signed)
 Requested Prescriptions  Pending Prescriptions Disp Refills   DULoxetine  (CYMBALTA ) 60 MG capsule 90 capsule 0    Sig: Take 1 capsule (60 mg total) by mouth daily.     Psychiatry: Antidepressants - SNRI - duloxetine  Passed - 11/02/2023 10:50 AM      Passed - Cr in normal range and within 360 days    Creatinine, Ser  Date Value Ref Range Status  04/04/2023 1.13 0.76 - 1.27 mg/dL Final         Passed - eGFR is 30 or above and within 360 days    GFR calc Af Amer  Date Value Ref Range Status  03/14/2019 80 >59 mL/min/1.73 Final   GFR calc non Af Amer  Date Value Ref Range Status  03/14/2019 69 >59 mL/min/1.73 Final   eGFR  Date Value Ref Range Status  04/04/2023 70 >59 mL/min/1.73 Final         Passed - Completed PHQ-2 or PHQ-9 in the last 360 days      Passed - Last BP in normal range    BP Readings from Last 1 Encounters:  08/02/23 108/70         Passed - Valid encounter within last 6 months    Recent Outpatient Visits           3 months ago Primary osteoarthritis of right knee   [place] [facility] Ku, Selinda PARAS, MD   3 months ago Essential hypertension   [place] [facility] Joshua Cathryne JAYSON, MD   6 months ago Primary osteoarthritis of right knee   [facility] Ku, Selinda PARAS, MD   6 months ago Dysuria   [facility] Joshua Cathryne JAYSON, MD   7 months ago Dysuria   [facility] Joshua Cathryne JAYSON, MD       Future Appointments             In 2 weeks Ku, Selinda PARAS, MD [facility] Primary Care & Sports Medicine at [facility], [address]   In 9 months McGowan, Clotilda DELENA RIGGERS [facility] [place]

## 2023-11-04 ENCOUNTER — Other Ambulatory Visit: Payer: Self-pay | Admitting: Family Medicine

## 2023-11-04 DIAGNOSIS — M1711 Unilateral primary osteoarthritis, right knee: Secondary | ICD-10-CM

## 2023-11-04 DIAGNOSIS — M1712 Unilateral primary osteoarthritis, left knee: Secondary | ICD-10-CM

## 2023-11-04 NOTE — Telephone Encounter (Signed)
 Requested Prescriptions  Refused Prescriptions Disp Refills   DULoxetine  (CYMBALTA ) 60 MG capsule [Pharmacy Med Name: DULoxetine  HCl 60 MG Oral Capsule Delayed Release Particles] 90 capsule 0    Sig: TAKE 1 CAPSULE BY MOUTH IN THE EVENING     Psychiatry: Antidepressants - SNRI - duloxetine  Passed - 11/04/2023  4:37 PM      Passed - Cr in normal range and within 360 days    Creatinine, Ser  Date Value Ref Range Status  04/04/2023 1.13 0.76 - 1.27 mg/dL Final         Passed - eGFR is 30 or above and within 360 days    GFR calc Af Amer  Date Value Ref Range Status  03/14/2019 80 >59 mL/min/1.73 Final   GFR calc non Af Amer  Date Value Ref Range Status  03/14/2019 69 >59 mL/min/1.73 Final   eGFR  Date Value Ref Range Status  04/04/2023 70 >59 mL/min/1.73 Final         Passed - Completed PHQ-2 or PHQ-9 in the last 360 days      Passed - Last BP in normal range    BP Readings from Last 1 Encounters:  08/02/23 108/70         Passed - Valid encounter within last 6 months    Recent Outpatient Visits           3 months ago Primary osteoarthritis of right knee   [place] [facility] Ku, Selinda PARAS, MD   3 months ago Essential hypertension   [place] [facility] Joshua Cathryne JAYSON, MD   6 months ago Primary osteoarthritis of right knee   [facility] Ku, Selinda PARAS, MD   6 months ago Dysuria   [facility] Joshua Cathryne JAYSON, MD   7 months ago Dysuria   [facility] Joshua Cathryne JAYSON, MD       Future Appointments             In 1 week Ku, Selinda PARAS, MD [facility] Primary Care & Sports Medicine at [facility], [address]   In 9 months McGowan, Clotilda DELENA RIGGERS [facility] [place]

## 2023-11-09 ENCOUNTER — Telehealth: Payer: Self-pay

## 2023-11-09 NOTE — Telephone Encounter (Signed)
 Appointment scheduled for [facility]

## 2023-11-09 NOTE — Telephone Encounter (Signed)
 Copied from CRM [id number]. Topic: Appointments - Transfer of Care >> Nov 09, 2023  3:48 PM Fonda T wrote: Pt is requesting to transfer FROM: Dr. Joshua Pt is requesting to transfer TO: Dr. Sol Reason for requested transfer: provider no longer at office It is the responsibility of the team the patient would like to transfer to (Dr. Sol) to reach out to the patient if for any reason this transfer is not acceptable.

## 2023-11-16 ENCOUNTER — Encounter: Payer: Self-pay | Admitting: Family Medicine

## 2023-11-16 ENCOUNTER — Other Ambulatory Visit (INDEPENDENT_AMBULATORY_CARE_PROVIDER_SITE_OTHER): Payer: Self-pay | Admitting: Radiology

## 2023-11-16 ENCOUNTER — Ambulatory Visit: Admitting: Family Medicine

## 2023-11-16 VITALS — BP 100/60 | HR 93 | Ht 69.0 in | Wt 238.2 lb

## 2023-11-16 DIAGNOSIS — I10 Essential (primary) hypertension: Secondary | ICD-10-CM | POA: Diagnosis not present

## 2023-11-16 DIAGNOSIS — M1712 Unilateral primary osteoarthritis, left knee: Secondary | ICD-10-CM | POA: Diagnosis not present

## 2023-11-16 DIAGNOSIS — M1711 Unilateral primary osteoarthritis, right knee: Secondary | ICD-10-CM | POA: Diagnosis not present

## 2023-11-16 MED ORDER — TRIAMCINOLONE ACETONIDE 40 MG/ML IJ SUSP
80.0000 mg | Freq: Once | INTRAMUSCULAR | Status: AC
  Administered 2023-11-16: 80 mg via INTRA_ARTICULAR

## 2023-11-16 NOTE — Assessment & Plan Note (Addendum)
 History of Present Illness Brandon Perez is a 68 year old male with osteoarthritis who presents with bilateral knee pain.  Bilateral knee pain - Chronic bilateral knee pain due to osteoarthritis - Left knee previously treated with cortisone injections, last administered in June - Pain flaring for the past few months, with most recent flare beginning approximately one month ago - Gel injections previously attempted without benefit - Nerve blocks have also been performed - Currently not taking Celebrex, as last prescription has been completed; Celebrex provided functional pain relief  Physical activity and functional status - Remains physically active and [place] starting on the fourth  Assessment and Plan Bilateral primary osteoarthritis of the knees Chronic bilateral knee pain due to osteoarthritis with recent flare-ups. Previous treatments include cortisone injections, Monovisc, and nerve blocks. Monovisc was ineffective. Current flare-up indicates some improvement in duration of relief from cortisone injections. Insurance issues limit access to certain treatments. - Administered cortisone injections to both knees. - Switch insurance to potentially cover the [facility] device and explore its use if covered. - Consider alternative viscosupplementation with Euflexxa if new insurance covers it. - Discussed low-dose radiation therapy as a future option if needed. - Refer to pain management for potential medication management. - Continue Celebrex for pain management. - Advise use of ibuprofen or other anti-inflammatory medications as needed. - Monitor response to cortisone injections and report if ineffective after two weeks.

## 2023-11-16 NOTE — Progress Notes (Signed)
 Primary Care / Sports Medicine Office Visit  Patient Information:  Patient ID: Brandon Perez, male DOB: 02/08/1956 Age: 68 y.o. MRN: [id number]   Brandon Perez is a pleasant 68 y.o. male presenting with the following:  Chief Complaint  Patient presents with   Knee Pain    Patient presents today for bil knee pain. He is requesting cortisone injections for both knees.     Vitals:   11/16/23 0812  BP: 100/60  Pulse: 93  SpO2: 96%   Vitals:   11/16/23 0812  Weight: 238 lb 3.2 oz (108 kg)  Height: 5' 9 (1.753 m)   Body mass index is 35.18 kg/m.  No results found.   Discussed the use of AI scribe software for clinical note transcription with the patient, who gave verbal consent to proceed.   Independent interpretation of notes and tests performed by another provider:   None  Procedures performed:   Procedure:  Injection of right knee under ultrasound guidance. Ultrasound guidance utilized for anteromedial approach, no effusion Samsung HS60 device utilized with permanent recording / reporting. Verbal informed consent obtained and verified. Skin prepped in a sterile fashion. Ethyl chloride for topical local analgesia.  Completed without difficulty and tolerated well. Medication: triamcinolone  acetonide 40 mg/mL suspension for injection 1 mL total and 2 mL lidocaine 1% without epinephrine utilized for needle placement anesthetic Advised to contact for fevers/chills, erythema, induration, drainage, or persistent bleeding.  Procedure:  Injection of left knee under ultrasound guidance. Ultrasound guidance utilized for anterolateral approach, joint space noted,  Samsung HS60 device utilized with permanent recording / reporting. Verbal informed consent obtained and verified. Skin prepped in a sterile fashion. Ethyl chloride for topical local analgesia.  Completed without difficulty and tolerated well. Medication: triamcinolone  acetonide 40 mg/mL suspension for injection 1  mL total and 2 mL lidocaine 1% without epinephrine utilized for needle placement anesthetic Advised to contact for fevers/chills, erythema, induration, drainage, or persistent bleeding.   Pertinent History, Exam, Impression, and Recommendations:   Problem List Items Addressed This Visit     Primary hypertension   Blood pressure and orthostatic symptoms - Currently monitoring blood pressure; measured 107 systolic this morning without antihypertensive medication - Has an upcoming appointment to transition care and discuss blood pressure management - History of a fall attributed to low blood pressure after taking antihypertensive medication, per patient  Plan - Check BP daily, if systolic (top below 110-120, skip lisinopril  - If symptomatic, check BP and dose if systolic above 120 - Keep visit with new PCP      Primary osteoarthritis of left knee   See additional assessment(s) for plan details.      Relevant Orders   US  LIMITED JOINT SPACE STRUCTURES LOW BILAT   Ambulatory referral to Pain Clinic   Primary osteoarthritis of right knee - Primary   History of Present Illness Brandon Perez is a 68 year old male with osteoarthritis who presents with bilateral knee pain.  Bilateral knee pain - Chronic bilateral knee pain due to osteoarthritis - Left knee previously treated with cortisone injections, last administered in June - Pain flaring for the past few months, with most recent flare beginning approximately one month ago - Gel injections previously attempted without benefit - Nerve blocks have also been performed - Currently not taking Celebrex, as last prescription has been completed; Celebrex provided functional pain relief  Physical activity and functional status - Remains physically active and [place] starting on the fourth  Assessment and Plan Bilateral primary osteoarthritis of the knees Chronic bilateral knee pain due to  osteoarthritis with recent flare-ups. Previous treatments include cortisone injections, Monovisc, and nerve blocks. Monovisc was ineffective. Current flare-up indicates some improvement in duration of relief from cortisone injections. Insurance issues limit access to certain treatments. - Administered cortisone injections to both knees. - Switch insurance to potentially cover the [facility] device and explore its use if covered. - Consider alternative viscosupplementation with Euflexxa if new insurance covers it. - Discussed low-dose radiation therapy as a future option if needed. - Refer to pain management for potential medication management. - Continue Celebrex for pain management. - Advise use of ibuprofen or other anti-inflammatory medications as needed. - Monitor response to cortisone injections and report if ineffective after two weeks.      Relevant Orders   US  LIMITED JOINT SPACE STRUCTURES LOW BILAT   Ambulatory referral to Pain Clinic     Orders & Medications Medications:  Meds ordered this encounter  Medications   triamcinolone  acetonide (KENALOG -40) injection 80 mg   Orders Placed This Encounter  Procedures   US  LIMITED JOINT SPACE STRUCTURES LOW BILAT   Ambulatory referral to Pain Clinic     No follow-ups on file.     Selinda JINNY Ku, MD, [facility]   Primary Care Sports Medicine Primary Care and Sports Medicine at [facility]

## 2023-11-16 NOTE — Assessment & Plan Note (Signed)
 See additional assessment(s) for plan details.

## 2023-11-16 NOTE — Patient Instructions (Signed)
 VISIT SUMMARY:  Today, we addressed your chronic bilateral knee pain due to osteoarthritis, which has been flaring up recently. We administered cortisone injections to both knees and discussed several future treatment options.  YOUR PLAN:  BILATERAL PRIMARY OSTEOARTHRITIS OF THE KNEES: You have chronic bilateral knee pain due to osteoarthritis, with recent flare-ups. Previous treatments include cortisone injections, Monovisc, and nerve blocks. -We administered cortisone injections to both knees today. -Consider switching your insurance to potentially cover the [facility] device and explore its use if covered. -Consider alternative viscosupplementation with Euflexxa if new insurance covers it. -We discussed low-dose radiation therapy as a future option if needed. -You will be referred to pain management for potential medication management. -Continue taking Celebrex for pain management. -Use ibuprofen or other anti-inflammatory medications as needed. -Monitor your response to the cortisone injections and report if they are ineffective after two weeks.

## 2023-11-16 NOTE — Assessment & Plan Note (Signed)
 Blood pressure and orthostatic symptoms - Currently monitoring blood pressure; measured 107 systolic this morning without antihypertensive medication - Has an upcoming appointment to transition care and discuss blood pressure management - History of a fall attributed to low blood pressure after taking antihypertensive medication, per patient  Plan - Check BP daily, if systolic (top below 110-120, skip lisinopril  - If symptomatic, check BP and dose if systolic above 120 - Keep visit with new PCP

## 2023-11-21 ENCOUNTER — Encounter: Payer: Self-pay | Admitting: Family Medicine

## 2023-11-21 ENCOUNTER — Ambulatory Visit (INDEPENDENT_AMBULATORY_CARE_PROVIDER_SITE_OTHER): Admitting: Family Medicine

## 2023-11-21 ENCOUNTER — Telehealth: Payer: Self-pay | Admitting: Pharmacy Technician

## 2023-11-21 ENCOUNTER — Telehealth: Payer: Self-pay

## 2023-11-21 ENCOUNTER — Other Ambulatory Visit (HOSPITAL_COMMUNITY): Payer: Self-pay

## 2023-11-21 VITALS — BP 100/66 | HR 109 | Ht 69.0 in | Wt 240.0 lb

## 2023-11-21 DIAGNOSIS — E782 Mixed hyperlipidemia: Secondary | ICD-10-CM | POA: Diagnosis not present

## 2023-11-21 DIAGNOSIS — E119 Type 2 diabetes mellitus without complications: Secondary | ICD-10-CM | POA: Insufficient documentation

## 2023-11-21 DIAGNOSIS — Z122 Encounter for screening for malignant neoplasm of respiratory organs: Secondary | ICD-10-CM

## 2023-11-21 DIAGNOSIS — Z7984 Long term (current) use of oral hypoglycemic drugs: Secondary | ICD-10-CM

## 2023-11-21 DIAGNOSIS — I10 Essential (primary) hypertension: Secondary | ICD-10-CM

## 2023-11-21 DIAGNOSIS — N400 Enlarged prostate without lower urinary tract symptoms: Secondary | ICD-10-CM | POA: Insufficient documentation

## 2023-11-21 MED ORDER — METFORMIN HCL 500 MG PO TABS: 1000.0000 mg | ORAL_TABLET | Freq: Two times a day (BID) | ORAL | 0 refills | Status: DC

## 2023-11-21 MED ORDER — TIRZEPATIDE 2.5 MG/0.5ML ~~LOC~~ SOAJ: 2.5000 mg | SUBCUTANEOUS | 0 refills | Status: DC

## 2023-11-21 MED ORDER — LISINOPRIL 5 MG PO TABS: 10.0000 mg | ORAL_TABLET | Freq: Every day | ORAL | 1 refills | Status: DC

## 2023-11-21 NOTE — Telephone Encounter (Signed)
 Pharmacy Patient Advocate Encounter   Received notification from CoverMyMeds that prior authorization for Mounjaro 2.5MG /0.5ML auto-injectors  is required/requested.   Insurance verification completed.   The patient is insured through [facility] ADVANTAGE/RX ADVANCE .   Per test claim: PA required and submitted KEY/EOC/Request #: [id number] from 11/21/23 to 11/20/24. Ran test claim, Copay is $47.00. This test claim was processed through [facility]- copay amounts may vary at other pharmacies due to pharmacy/plan contracts, or as the patient moves through the different stages of their insurance plan.

## 2023-11-21 NOTE — Progress Notes (Signed)
 Established Patient Office Visit  Subjective   Patient ID: Brandon Perez, male    DOB: 01-03-1956  Age: 68 y.o. MRN: [id number]  Chief Complaint  Patient presents with   Establish Care    Patient is here to establish care with new pcp     Assessment & Plan:   Problem List Items Addressed This Visit       Cardiovascular and Mediastinum   Primary hypertension - Primary   Relevant Medications   lisinopril  (ZESTRIL ) 5 MG tablet     Endocrine   Diabetes mellitus treated with oral medication (HCC)   Relevant Medications   lisinopril  (ZESTRIL ) 5 MG tablet   metFORMIN  (GLUCOPHAGE ) 500 MG tablet   tirzepatide (MOUNJARO) 2.5 MG/0.5ML Pen     Genitourinary   Benign prostatic hyperplasia without lower urinary tract symptoms     Other   Mixed hyperlipidemia   Relevant Medications   lisinopril  (ZESTRIL ) 5 MG tablet   Obesity, morbid (HCC)   Relevant Medications   metFORMIN  (GLUCOPHAGE ) 500 MG tablet   tirzepatide (MOUNJARO) 2.5 MG/0.5ML Pen   Other Visit Diagnoses       Essential hypertension       Relevant Medications   lisinopril  (ZESTRIL ) 5 MG tablet     Screening for lung cancer       Relevant Orders   CT CHEST LUNG CA SCREEN LOW DOSE W/O CM     New onset type 2 diabetes mellitus (HCC)       Relevant Medications   lisinopril  (ZESTRIL ) 5 MG tablet   metFORMIN  (GLUCOPHAGE ) 500 MG tablet   tirzepatide (MOUNJARO) 2.5 MG/0.5ML Pen      Assessment and Plan Assessment & Plan Essential hypertension Blood pressure low at 100/66 mmHg with mild orthostatic lightheadedness, likely due to 40-pound weight loss. - Reduce lisinopril  to 5 mg by taking half a pill. - Monitor blood pressure regularly, target 110-130 mmHg systolic. - Schedule follow-up if hypotension or symptoms persist to evaluate cardiac function.  Type 2 diabetes mellitus A1c at 7.0%. Increasing metformin  may improve glycemic control and aid weight loss. - Increase metformin  to 1000 mg daily, 500 mg morning  and evening. - Gradually increase metformin  to 1500 mg and then 2000 mg as tolerated. - Adjust prescription for adequate metformin  supply. - Consider weight loss medication if covered by insurance.  Mixed hyperlipidemia Currently on cholesterol medication. No changes needed.  Knee osteoarthritis Knee pain managed with injections every three months, relief lasts 1.5 to 2 months. Weight loss may reduce pain. - Consider weight loss medication if covered by insurance.  Obesity Recent 40-pound weight loss, current weight 240 pounds. Further weight loss may benefit health and knee pain. - Explore insurance coverage for weight loss medication.  General Health Maintenance Discussed low-dose CT scan for lung cancer screening due to 40-year smoking history, quit 4 years ago. - Schedule low-dose CT scan for lung cancer screening.   Return in about 5 weeks (around 12/26/2023) for chronic follow up with PCP.   HPI Discussed the use of AI scribe software for clinical note transcription with the patient, who gave verbal consent to proceed.  History of Present Illness Brandon Perez is a 68 year old male with hypertension, diabetes, and high cholesterol who presents for routine follow-up.  He has experienced low blood pressure readings, with a recent measurement of 108/66 mmHg, and lightheadedness, particularly upon standing. He reports a 40-pound weight loss, which he achieved through dietary changes and increased physical activity. He is  currently taking lisinopril  10 mg daily.  He has a history of diabetes and is currently taking metformin  500 mg once daily. His A1c is 7.0%.  He has high cholesterol and is taking medication for it, though the specific medication and dose were not discussed.  He has a history of prostate issues and is taking Flomax  daily at bedtime.  He experiences knee pain due to arthritis and receives injections every three months, which provide relief for about one and a half  to two months. He has previously tried Cymbalta  for pain management without success and has been prescribed oxycodone  for additional pain relief.  He quit smoking four years ago after smoking for approximately 40 years. He does not consume alcohol.  No chest pain, shortness of breath, or leg swelling. No thyroid issues or pancreatitis.    Review of Systems  All other systems reviewed and are negative.     Objective:     BP 100/66   Pulse (!) 109   Ht 5' 9 (1.753 m)   Wt 240 lb (108.9 kg)   SpO2 96%   BMI 35.44 kg/m     Physical Exam Vitals and nursing note reviewed.  Constitutional:      Appearance: Normal appearance.  HENT:     Head: Normocephalic.     Right Ear: External ear normal.     Left Ear: External ear normal.  Eyes:     Conjunctiva/sclera: Conjunctivae normal.  Cardiovascular:     Rate and Rhythm: Normal rate.  Pulmonary:     Effort: Pulmonary effort is normal. No respiratory distress.  Abdominal:     Palpations: Abdomen is soft.  Musculoskeletal:        General: Normal range of motion.  Skin:    General: Skin is warm.  Neurological:     Mental Status: He is alert and oriented to person, place, and time.  Psychiatric:        Mood and Affect: Mood normal.      No results found for any visits on 11/21/23.     The 10-year ASCVD risk score (Arnett DK, et al., 2019) is: 24.9%      Vinary K Myonna Chisom, MD

## 2023-11-21 NOTE — Telephone Encounter (Signed)
 Please review PA outcome.

## 2023-11-22 ENCOUNTER — Telehealth: Payer: Self-pay

## 2023-11-22 NOTE — Telephone Encounter (Signed)
 Copied from CRM #[id number]. Topic: Clinical - Medication Question >> Nov 22, 2023 10:54 AM Avram MATSU wrote: Reason for CRM: patient would like to know if ne needs to continue taking metFORMIN  (GLUCOPHAGE ) 500 MG tablet [[id number]] while on tirzepatide (MOUNJARO) 2.5 MG/0.5ML Pen [[id number]] please advise [phone] (H) okay to leave vm

## 2023-11-22 NOTE — Telephone Encounter (Signed)
 Spoke with patient, informed him that his PA was approved. Patient said he had already picked it up and had no questions or concerns.

## 2023-11-22 NOTE — Telephone Encounter (Signed)
 Spoke with patient and advise . He verbalized understanding.  JM

## 2023-11-22 NOTE — Telephone Encounter (Signed)
 He can continue metformin  along with Mounjaro.

## 2023-11-22 NOTE — Telephone Encounter (Signed)
 Please review. JM

## 2023-11-22 NOTE — Telephone Encounter (Signed)
 SABRA

## 2023-12-06 ENCOUNTER — Encounter: Payer: Self-pay | Admitting: Urology

## 2023-12-14 ENCOUNTER — Other Ambulatory Visit: Payer: Self-pay | Admitting: Family Medicine

## 2023-12-14 DIAGNOSIS — E119 Type 2 diabetes mellitus without complications: Secondary | ICD-10-CM

## 2023-12-15 NOTE — Telephone Encounter (Signed)
 Requested medications are due for refill today.  yes  Requested medications are on the active medications list.  yes  Last refill. 11/21/2023 2mL 0 rf  Future visit scheduled.   yes  Notes to clinic.  Medication not assigned to a protocol. Please review for refill.    Requested Prescriptions  Pending Prescriptions Disp Refills   MOUNJARO 2.5 MG/0.5ML Pen [Pharmacy Med Name: Mounjaro 2.5 MG/0.5ML Subcutaneous Solution Pen-injector] 4 mL 0    Sig: INJECT 2.5MG  SUBCUTANEOUSLY  ONCE A WEEK     Off-Protocol Failed - 12/15/2023  5:43 PM      Failed - Medication not assigned to a protocol, review manually.      Passed - Valid encounter within last 12 months    Recent Outpatient Visits           3 weeks ago Primary hypertension   [place] [facility], Vinay K, MD   4 weeks ago Primary osteoarthritis of right knee   [facility], Selinda PARAS, MD   4 months ago Primary osteoarthritis of right knee   [facility], Selinda PARAS, MD   4 months ago Essential hypertension   [place] [facility] Cathryne JAYSON, MD   7 months ago Primary osteoarthritis of right knee   [facility] at [facility], Selinda PARAS, MD       Future Appointments             In 7 months McG[place]an, Clotilda DELENA RIGGERS [facility] [place]

## 2023-12-16 NOTE — Telephone Encounter (Signed)
 Please review medication refill request

## 2023-12-19 ENCOUNTER — Other Ambulatory Visit: Payer: Self-pay | Admitting: Family Medicine

## 2023-12-19 DIAGNOSIS — E119 Type 2 diabetes mellitus without complications: Secondary | ICD-10-CM

## 2023-12-19 MED ORDER — TIRZEPATIDE 5 MG/0.5ML ~~LOC~~ SOAJ: 5.0000 mg | SUBCUTANEOUS | 0 refills | Status: DC

## 2023-12-20 DIAGNOSIS — Z79899 Other long term (current) drug therapy: Secondary | ICD-10-CM | POA: Insufficient documentation

## 2023-12-20 DIAGNOSIS — M899 Disorder of bone, unspecified: Secondary | ICD-10-CM | POA: Insufficient documentation

## 2023-12-20 DIAGNOSIS — E66812 Obesity, class 2: Secondary | ICD-10-CM | POA: Insufficient documentation

## 2023-12-20 DIAGNOSIS — Z789 Other specified health status: Secondary | ICD-10-CM | POA: Insufficient documentation

## 2023-12-20 DIAGNOSIS — G894 Chronic pain syndrome: Secondary | ICD-10-CM | POA: Insufficient documentation

## 2023-12-20 NOTE — Progress Notes (Unsigned)
 PROVIDER NOTE: Interpretation of information contained herein should be left to medically-trained personnel. Specific patient instructions are provided elsewhere under Patient Instructions section of medical record. This document was created in part using AI and STT-dictation technology, any transcriptional errors that may result from this process are unintentional.  Patient: Brandon Perez  Service: E/M Encounter  Provider: Eric DELENA Como, MD  DOB: Jan 20, 1956  Delivery: Face-to-face  Specialty: Interventional Pain Management  MRN: [id number]  Setting: Ambulatory outpatient facility  Specialty designation: 09  Type: New Patient  Location: Outpatient office facility  PCP: Kotturi, Vinay K, MD  DOS: 12/21/2023    Referring Prov.: Alvia Selinda PARAS, MD   Primary Reason(s) for Visit: Encounter for initial evaluation of one or more chronic problems (new to examiner) potentially causing chronic pain, and posing a threat to normal musculoskeletal function. (Level of risk: High) CC: No chief complaint on file.  HPI  Mr. Tuman is a 68 y.o. year old, male patient, who comes for the first time to our practice referred by Alvia Selinda PARAS, MD for our initial evaluation of his chronic pain. He has Primary osteoarthritis of knee (Left); Primary osteoarthritis of knee (Right); Calcific Achilles tendinitis of lower extremity (Left); Achilles tendon tear, initial encounter (Left); Primary hypertension; Mixed hyperlipidemia; Diabetes mellitus treated with oral medication (HCC); Benign prostatic hyperplasia without lower urinary tract symptoms; Chronic pain syndrome; Pharmacologic therapy; Disorder of skeletal system; Problems influencing health status; and Obesity, Class II, BMI 35-39.9 on their problem list. Today he comes in for evaluation of his No chief complaint on file.  Pain Assessment: Location:     Radiating:   Onset:   Duration:   Quality:   Severity:  /10 (subjective, self-reported pain score)   Effect on ADL:   Timing:   Modifying factors:   BP:    HR:    Onset and Duration: {Hx; Onset and Duration:210120511} Cause of pain: {Hx; Cause:210120521} Severity: {Pain Severity:210120502} Timing: {Symptoms; Timing:210120501} Aggravating Factors: {Causes; Aggravating pain factors:210120507} Alleviating Factors: {Causes; Alleviating Factors:210120500} Associated Problems: {Hx; Associated problems:210120515} Quality of Pain: {Hx; Symptom quality or Descriptor:210120531} Previous Examinations or Tests: {Hx; Previous examinations or test:210120529} Previous Treatments: {Hx; Previous Treatment:210120503}  Mr. Lengacher is being evaluated for possible interventional pain management therapies for the treatment of his chronic pain.  Discussed the use of AI scribe software for clinical note transcription with the patient, who gave verbal consent to proceed.  History of Present Illness            Mr. Hinojos has been informed that this initial visit was an evaluation only.  On the follow up appointment I will go over the results, including ordered tests and available interventional therapies. At that time he will have the opportunity to decide whether to proceed with offered therapies or not. In the event that Mr. Crull prefers avoiding interventional options, this will conclude our involvement in the case.  Medication management recommendations may be provided upon request.  Patient informed that diagnostic tests may be ordered to assist in identifying underlying causes, narrow the list of differential diagnoses and aid in determining candidacy for (or contraindications to) planned therapeutic interventions.  Historic Controlled Substance Pharmacotherapy Review PMP and historical list of controlled substances: Oxycodone /APAP 5/325 (# 30) (last filled on 08/03/2023); tramadol  50 mg tablet, 1 tab p.o. 3 times daily (# 15) (last filled on 03/03/2023) Most recently prescribed controlled substance(s):  Opioid Analgesic: None MME/day: 0 mg/day  Historical Monitoring: The patient  reports no history of drug  use. List of prior UDS Testing: No results found for: MDMA, COCAINSCRNUR, PCPSCRNUR, PCPQUANT, CANNABQUANT, THCU, ETH, CBDTHCR, D8THCCBX, D9THCCBX Historical Background Evaluation: [place] PMP: PDMP reviewed during this encounter. Review of the past 56-months conducted.             PMP NARX Score Report:  Narcotic: 160 Sedative: 070 Stimulant: 000 [place] Department of public safety, offender search: Engineer, [employer] Information) Non-contributory Risk Assessment Profile: Aberrant behavior: None observed or detected today Risk factors for fatal opioid overdose: None identified today PMP NARX Overdose Risk Score: 140 Fatal overdose hazard ratio (HR): Calculation deferred Non-fatal overdose hazard ratio (HR): Calculation deferred Risk of opioid abuse or dependence: 0.7-3.0% with doses <= 36 MME/day and 6.1-26% with doses >= 120 MME/day. Substance use disorder (SUD) risk level: See below Personal History of Substance Abuse (SUD-Substance use disorder):  Alcohol:    Illegal Drugs:    Rx Drugs:    ORT Risk Level calculation:    ORT Scoring interpretation table:  Score <3 = Low Risk for SUD  Score between 4-7 = Moderate Risk for SUD  Score >8 = High Risk for Opioid Abuse   PHQ-2 Depression Scale:  Total score:    PHQ-2 Scoring interpretation table: (Score and probability of major depressive disorder)  Score 0 = No depression  Score 1 = 15.4% Probability  Score 2 = 21.1% Probability  Score 3 = 38.4% Probability  Score 4 = 45.5% Probability  Score 5 = 56.4% Probability  Score 6 = 78.6% Probability   PHQ-9 Depression Scale:  Total score:    PHQ-9 Scoring interpretation table:  Score 0-4 = No depression  Score 5-9 = Mild depression  Score 10-14 = Moderate depression  Score 15-19 = Moderately severe depression  Score 20-27 = Severe depression (2.4 times higher risk of SUD  and 2.89 times higher risk of overuse)   Pharmacologic Plan: As per protocol, I have not taken over any controlled substance management, pending the results of ordered tests and/or consults.            Initial impression: Pending review of available data and ordered tests.  Meds   Current Outpatient Medications:    tirzepatide (MOUNJARO) 5 MG/0.5ML Pen, Inject 5 mg into the skin once a week., Disp: 2 mL, Rfl: 0   aspirin 81 MG tablet, Take 81 mg by mouth daily., Disp: , Rfl:    atorvastatin  (LIPITOR) 10 MG tablet, Take 1 tablet (10 mg total) by mouth daily., Disp: 90 tablet, Rfl: 1   DULoxetine  (CYMBALTA ) 60 MG capsule, Take 1 capsule (60 mg total) by mouth daily. (Patient not taking: Reported on 11/21/2023), Disp: 90 capsule, Rfl: 0   lisinopril  (ZESTRIL ) 5 MG tablet, Take 2 tablets (10 mg total) by mouth daily., Disp: 90 tablet, Rfl: 1   metFORMIN  (GLUCOPHAGE ) 500 MG tablet, Take 2 tablets (1,000 mg total) by mouth 2 (two) times daily with a meal., Disp: 360 tablet, Rfl: 0   tamsulosin  (FLOMAX ) 0.4 MG CAPS capsule, Take 1 capsule (0.4 mg total) by mouth daily., Disp: 30 capsule, Rfl: 11  Imaging Review  Knee Imaging: Knee-R DG 4 views: Results for orders placed during the hospital encounter of 05/12/21 DG Knee Complete 4 Views Right  Narrative CLINICAL DATA:  pain  EXAM: RIGHT KNEE - COMPLETE 4+ VIEW  COMPARISON:  None.  FINDINGS: Osteopenia. No acute fracture or dislocation. Moderate to severe degenerative changes of the medial compartment. Mild degenerative changes of the patellofemoral and lateral compartments. Enthesopathic changes of  the quadriceps tendon insertion on the patella. No area of erosion or osseous destruction. No unexpected radiopaque foreign body. Vascular calcifications.  IMPRESSION: Tricompartmental degenerative changes most pronounced in the medial compartment, LEFT greater than RIGHT knee.   Electronically Signed By: Corean Salter M.D. On:  05/15/2021 16:27  Knee-L DG 4 views: Results for orders placed during the hospital encounter of 05/12/21 DG Knee Complete 4 Views Left  Narrative CLINICAL DATA:  bilateral knee pain, worse in the left  EXAM: LEFT KNEE - COMPLETE 4+ VIEW  COMPARISON:  Jul 10, 2008  FINDINGS: Osteopenia. No acute fracture or dislocation. Severe degenerative changes of the medial compartment with joint space narrowing, osteophyte formation and subchondral sclerosis. Mild degenerative changes of the lateral and patellofemoral compartments. Revisualization of multiple sclerotic foci of the visualized proximal tibia, likely reflecting the sequela of remote prior osseous infarct versus osteochondroma. Enthesopathic changes of the quadriceps tendon insertion. Vascular calcifications. No unexpected radiopaque foreign body.  IMPRESSION: Severe degenerative changes of the medial compartment.   Electronically Signed By: Corean Salter M.D. On: 05/15/2021 16:25  Foot Imaging: Foot-L DG Complete: Results for orders placed during the hospital encounter of 05/12/21 DG Foot Complete Left  Narrative CLINICAL DATA:  foot pain  EXAM: LEFT FOOT - COMPLETE 3+ VIEW  COMPARISON:  None.  FINDINGS: Osteopenia. No acute fracture or dislocation. Mild degenerative changes of the first MTP. Enthesopathic changes of the base of the fifth metatarsal, Achilles tendon and plantar calcaneus. Sequela of remote prior trauma of the lateral hallux sesamoid. Prominent os peroneum versus sequela of remote prior trauma. No area of erosion or osseous destruction. No unexpected radiopaque foreign body. Soft tissues are unremarkable.  IMPRESSION: No acute fracture or dislocation.   Electronically Signed By: Corean Salter M.D. On: 05/15/2021 16:23  Complexity Note: Imaging results reviewed.                         ROS  Cardiovascular: {Hx; Cardiovascular History:210120525} Pulmonary or Respiratory: {Hx;  Pumonary and/or Respiratory History:210120523} Neurological: {Hx; Neurological:210120504} Psychological-Psychiatric: {Hx; Psychological-Psychiatric History:210120512} Gastrointestinal: {Hx; Gastrointestinal:210120527} Genitourinary: {Hx; Genitourinary:210120506} Hematological: {Hx; Hematological:210120510} Endocrine: {Hx; Endocrine history:210120509} Rheumatologic: {Hx; Rheumatological:210120530} Musculoskeletal: {Hx; Musculoskeletal:210120528} Work History: {Hx; Work history:210120514}  Allergies  Mr. Vangilder is allergic to penicillin g.  Laboratory Chemistry Profile   Renal Lab Results  Component Value Date   BUN 12 04/04/2023   CREATININE 1.13 04/04/2023   BCR 11 04/04/2023   GFRAA 80 03/14/2019   GFRNONAA 69 03/14/2019   SPECGRAV 1.020 04/18/2023   PHUR 6.0 04/18/2023   PROTEINUR NEGATIVE 08/01/2023     Electrolytes Lab Results  Component Value Date   NA 136 04/04/2023   K 4.4 04/04/2023   CL 99 04/04/2023   CALCIUM  9.0 04/04/2023   PHOS 2.9 06/12/2021     Hepatic Lab Results  Component Value Date   AST 17 04/04/2023   ALT 16 04/04/2023   ALBUMIN 4.2 04/04/2023   ALKPHOS 85 04/04/2023     ID No results found for: LYMEIGGIGMAB, HIV, SARSCOV2NAA, STAPHAUREUS, MRSAPCR, HCVAB, PREGTESTUR, RMSFIGG, QFVRPH1IGG, QFVRPH2IGG   Bone No results found for: VD25OH, [id number], CI6874NY7, CI7874NY7, 25OHVITD1, 25OHVITD2, 25OHVITD3, TESTOFREE, TESTOSTERONE   Endocrine Lab Results  Component Value Date   GLUCOSE 99 04/04/2023   GLUCOSEU NEGATIVE 08/01/2023   HGBA1C 7.0 (H) 04/04/2023     Neuropathy Lab Results  Component Value Date   HGBA1C 7.0 (H) 04/04/2023     CNS No results found for:  COLORCSF, APPEARCSF, RBCCOUNTCSF, WBCCSF, POLYSCSF, LYMPHSCSF, EOSCSF, PROTEINCSF, GLUCCSF, JCVIRUS, CSFOLI, IGGCSF, LABACHR, ACETBL   Inflammation (CRP: Acute  ESR: Chronic) No results found for: CRP,  ESRSEDRATE, LATICACIDVEN   Rheumatology No results found for: RF, ANA, LABURIC, URICUR, LYMEIGGIGMAB, LYMEABIGMQN, HLAB27   Coagulation No results found for: INR, LABPROT, APTT, PLT, DDIMER, LABHEMA, VITAMINK1, AT3   Cardiovascular No results found for: BNP, CKTOTAL, CKMB, TROPONINI, HGB, HCT, LABVMA, EPIRU, EPINEPH24HUR, NOREPRU, NOREPI24HUR, DOPARU, DOPAM24HRUR   Screening No results found for: SARSCOV2NAA, COVIDSOURCE, STAPHAUREUS, MRSAPCR, HCVAB, HIV, PREGTESTUR   Cancer No results found for: CEA, CA125, LABCA2   Allergens No results found for: ALMOND, APPLE, ASPARAGUS, AVOCADO, BANANA, BARLEY, BASIL, BAYLEAF, GREENBEAN, LIMABEAN, WHITEBEAN, BEEFIGE, REDBEET, BLUEBERRY, BROCCOLI, CABBAGE, MELON, CARROT, CASEIN, CASHEWNUT, CAULIFLOWER, CELERY     Note: Lab results reviewed.  PFSH  Drug: Mr. Brumett  reports no history of drug use. Alcohol:  reports no history of alcohol use. Tobacco:  reports that he quit smoking about 4 years ago. His smoking use included cigarettes. He started smoking about 34 years ago. He has a 30 pack-year smoking history. He has never used smokeless tobacco. Medical:  has a past medical history of Arthritis and Hypertension. Family: family history includes Cancer in his sister; Heart disease in his father and mother; Hypertension in his brother.  Past Surgical History:  Procedure Laterality Date   COLONOSCOPY  02/23/2012   normal   Active Ambulatory Problems    Diagnosis Date Noted   Primary osteoarthritis of knee (Left) 05/14/2021   Primary osteoarthritis of knee (Right) 05/14/2021   Calcific Achilles tendinitis of lower extremity (Left) 05/14/2021   Achilles tendon tear, initial encounter (Left) 11/09/2021   Primary hypertension 11/16/2023   Mixed hyperlipidemia 11/21/2023   Diabetes mellitus treated with oral medication (HCC)  11/21/2023   Benign prostatic hyperplasia without lower urinary tract symptoms 11/21/2023   Chronic pain syndrome 12/20/2023   Pharmacologic therapy 12/20/2023   Disorder of skeletal system 12/20/2023   Problems influencing health status 12/20/2023   Obesity, Class II, BMI 35-39.9 12/20/2023   Resolved Ambulatory Problems    Diagnosis Date Noted   Obesity, morbid (HCC) 11/21/2023   Past Medical History:  Diagnosis Date   Arthritis    Hypertension    Constitutional Exam  General appearance: Well nourished, well developed, and well hydrated. In no apparent acute distress There were no vitals filed for this visit. BMI Assessment: Estimated body mass index is 35.44 kg/m as calculated from the following:   Height as of 11/21/23: 5' 9 (1.753 m).   Weight as of 11/21/23: 240 lb (108.9 kg).  BMI interpretation table: BMI level Category Range association with higher incidence of chronic pain  <18 kg/m2 Underweight   18.5-24.9 kg/m2 Ideal body weight   25-29.9 kg/m2 Overweight Increased incidence by 20%  30-34.9 kg/m2 Obese (Class I) Increased incidence by 68%  35-39.9 kg/m2 Severe obesity (Class II) Increased incidence by 136%  >40 kg/m2 Extreme obesity (Class III) Increased incidence by 254%   Patient's current BMI Ideal Body weight  There is no height or weight on file to calculate BMI. Patient weight not recorded   BMI Readings from Last 4 Encounters:  11/21/23 35.44 kg/m  11/16/23 35.18 kg/m  08/02/23 36.18 kg/m  07/22/23 36.18 kg/m   Wt Readings from Last 4 Encounters:  11/21/23 240 lb (108.9 kg)  11/16/23 238 lb 3.2 oz (108 kg)  08/02/23 245 lb (111.1 kg)  07/22/23 245 lb (111.1 kg)  Psych/Mental status: Alert, oriented x 3 (person, place, & time)       Eyes: PERLA Respiratory: No evidence of acute respiratory distress  Assessment  Primary Diagnosis & Pertinent Problem List: The primary encounter diagnosis was Chronic pain syndrome. Diagnoses of Pharmacologic  therapy, Disorder of skeletal system, and Problems influencing health status were also pertinent to this visit.  Visit Diagnosis (New problems to examiner): 1. Chronic pain syndrome   2. Pharmacologic therapy   3. Disorder of skeletal system   4. Problems influencing health status    Plan of Care (Initial workup plan)  Note: Mr. Monestime was reminded that as per protocol, today's visit has been an evaluation only. We have not taken over the patient's controlled substance management.  Problem-specific plan: Assessment and Plan            Lab Orders  No laboratory test(s) ordered today   Imaging Orders  No imaging studies ordered today   Referral Orders  No referral(s) requested today   Procedure Orders    No procedure(s) ordered today   Pharmacotherapy (current): Medications ordered:  No orders of the defined types were placed in this encounter.  Medications administered during this visit: Mylan Schwarz had no medications administered during this visit.   Analgesic Pharmacotherapy:  Opioid Analgesics: For patients currently taking or requesting to take opioid analgesics, in accordance with [place]  Medical Board Guidelines, we will assess their risks and indications for the use of these substances. After completing our evaluation, we may offer recommendations, but we no longer take patients for medication management. The prescribing physician will ultimately decide, based on his/her training and level of comfort whether to adopt any of the recommendations, including whether or not to prescribe such medicines.  Membrane stabilizer: To be determined at a later time  Muscle relaxant: To be determined at a later time  NSAID: To be determined at a later time  Other analgesic(s): To be determined at a later time   Interventional management options: Mr. Hortman was informed that there is no guarantee that he would be a candidate for interventional therapies. The decision will be  based on the results of diagnostic studies, as well as Mr. Torrence's risk profile.  Procedure(s) under consideration:  Pending results of ordered studies     Interventional Therapies  Risk Factors  Considerations  Medical Comorbidities:     Planned  Pending:      Under consideration:   Pending   Completed: (Analgesic benefit)1  None at this time   Therapeutic  Palliative (PRN) options:   None established   Completed by other providers:   None reported  1(Analgesic benefit): Expressed in percentage (%). (Local anesthetic[LA] +/- sedation  L.A.Local Anesthetic  Steroid benefit  Ongoing benefit)   Provider-requested follow-up: No follow-ups on file.  Future Appointments  Date Time Provider Department Center  12/21/2023 10:00 AM Tanya Glisson, MD [facility] None  12/26/2023  8:00 AM Sol Mackey POUR, MD [facility] [id number]  06/07/2024  2:30 PM PCM-ANNUAL WELLNESS VISIT [facility] [address]  07/30/2024  8:20 AM Helon, Clotilda A, PA-C [facility] None   I discussed the assessment and treatment plan with the patient. The patient was provided an opportunity to ask questions and all were answered. The patient agreed with the plan and demonstrated an understanding of the instructions.  Patient advised to call back or seek an in-person evaluation if the symptoms or condition worsens.  Duration of encounter: *** minutes.  Total time on encounter, as  per AMA guidelines included both the face-to-face and non-face-to-face time personally spent by the physician and/or other qualified health care professional(s) on the day of the encounter (includes time in activities that require the physician or other qualified health care professional and does not include time in activities normally performed by clinical staff). Physician's time may include the following activities when performed: Preparing to see the patient (e.g., pre-charting review of records, searching for previously ordered  imaging, lab work, and nerve conduction tests) Review of prior analgesic pharmacotherapies. Reviewing PMP Interpreting ordered tests (e.g., lab work, imaging, nerve conduction tests) Performing post-procedure evaluations, including interpretation of diagnostic procedures Obtaining and/or reviewing separately obtained history Performing a medically appropriate examination and/or evaluation Counseling and educating the patient/family/caregiver Ordering medications, tests, or procedures Referring and communicating with other health care professionals (when not separately reported) Documenting clinical information in the electronic or other health record Independently interpreting results (not separately reported) and communicating results to the patient/ family/caregiver Care coordination (not separately reported)  Note by: Eric DELENA Como, MD (TTS and AI technology used. I apologize for any typographical errors that were not detected and corrected.) Date: 12/21/2023; Time: 1:21 PM

## 2023-12-20 NOTE — Patient Instructions (Signed)

## 2023-12-21 ENCOUNTER — Ambulatory Visit: Admitting: Pain Medicine

## 2023-12-21 ENCOUNTER — Encounter: Payer: Self-pay | Admitting: Pain Medicine

## 2023-12-21 ENCOUNTER — Ambulatory Visit
Admission: RE | Admit: 2023-12-21 | Discharge: 2023-12-21 | Disposition: A | Discharge status: Home / Self Care | Source: Ambulatory Visit | Attending: Pain Medicine | Admitting: Pain Medicine

## 2023-12-21 ENCOUNTER — Ambulatory Visit
Admission: RE | Admit: 2023-12-21 | Discharge: 2023-12-21 | Disposition: A | Source: Ambulatory Visit | Attending: Pain Medicine | Admitting: Pain Medicine

## 2023-12-21 VITALS — BP 126/85 | HR 92 | Temp 97.2°F | Ht 69.0 in | Wt 228.0 lb

## 2023-12-21 DIAGNOSIS — G8929 Other chronic pain: Secondary | ICD-10-CM | POA: Insufficient documentation

## 2023-12-21 DIAGNOSIS — M7051 Other bursitis of knee, right knee: Secondary | ICD-10-CM

## 2023-12-21 DIAGNOSIS — M899 Disorder of bone, unspecified: Secondary | ICD-10-CM | POA: Diagnosis not present

## 2023-12-21 DIAGNOSIS — M25561 Pain in right knee: Secondary | ICD-10-CM | POA: Insufficient documentation

## 2023-12-21 DIAGNOSIS — M170 Bilateral primary osteoarthritis of knee: Secondary | ICD-10-CM | POA: Insufficient documentation

## 2023-12-21 DIAGNOSIS — G894 Chronic pain syndrome: Secondary | ICD-10-CM | POA: Diagnosis not present

## 2023-12-21 DIAGNOSIS — M1712 Unilateral primary osteoarthritis, left knee: Secondary | ICD-10-CM | POA: Diagnosis not present

## 2023-12-21 DIAGNOSIS — Z789 Other specified health status: Secondary | ICD-10-CM | POA: Diagnosis not present

## 2023-12-21 DIAGNOSIS — M7052 Other bursitis of knee, left knee: Secondary | ICD-10-CM | POA: Diagnosis not present

## 2023-12-21 DIAGNOSIS — M25562 Pain in left knee: Secondary | ICD-10-CM | POA: Insufficient documentation

## 2023-12-21 DIAGNOSIS — Z79899 Other long term (current) drug therapy: Secondary | ICD-10-CM | POA: Diagnosis not present

## 2023-12-21 DIAGNOSIS — M1711 Unilateral primary osteoarthritis, right knee: Secondary | ICD-10-CM | POA: Diagnosis not present

## 2023-12-21 NOTE — Progress Notes (Signed)
 Safety precautions to be maintained throughout the outpatient stay will include: orient to surroundings, keep bed in low position, maintain call bell within reach at all times, provide assistance with transfer out of bed and ambulation.

## 2023-12-25 LAB — COMPLIANCE DRUG ANALYSIS, UR

## 2023-12-26 ENCOUNTER — Ambulatory Visit (INDEPENDENT_AMBULATORY_CARE_PROVIDER_SITE_OTHER): Admitting: Family Medicine

## 2023-12-26 ENCOUNTER — Encounter: Payer: Self-pay | Admitting: Family Medicine

## 2023-12-26 VITALS — BP 124/76 | HR 94 | Ht 69.0 in | Wt 227.0 lb

## 2023-12-26 DIAGNOSIS — E119 Type 2 diabetes mellitus without complications: Secondary | ICD-10-CM | POA: Diagnosis not present

## 2023-12-26 DIAGNOSIS — Z7985 Long-term (current) use of injectable non-insulin antidiabetic drugs: Secondary | ICD-10-CM

## 2023-12-26 DIAGNOSIS — Z7984 Long term (current) use of oral hypoglycemic drugs: Secondary | ICD-10-CM

## 2023-12-26 LAB — POCT GLYCOSYLATED HEMOGLOBIN (HGB A1C): Hemoglobin A1C: 5.7 % — AB (ref 4.0–5.6)

## 2023-12-26 MED ORDER — TIRZEPATIDE 7.5 MG/0.5ML ~~LOC~~ SOAJ: 7.5000 mg | SUBCUTANEOUS | 0 refills | Status: DC

## 2023-12-26 NOTE — Progress Notes (Signed)
 Established Patient Office Visit  Subjective   Patient ID: Brandon Perez, male    DOB: 1955/03/03  Age: 68 y.o. MRN: [id number]  Chief Complaint  Patient presents with   Diabetes     Assessment & Plan:   Problem List Items Addressed This Visit       Endocrine   Diabetes mellitus treated with oral medication (HCC) - Primary   Relevant Medications   tirzepatide (MOUNJARO) 7.5 MG/0.5ML Pen   Other Relevant Orders   POCT HgB A1C (Completed)   Wt Readings from Last 3 Encounters:  12/26/23 227 lb (103 kg)  12/21/23 228 lb (103.4 kg)  11/21/23 240 lb (108.9 kg)    Lab Results  Component Value Date   HGBA1C 5.7 (A) 12/26/2023   A1C improved. Mounjaro dose increased as above, continue Metformin .  Recommend healthy diet and exercise.    Return in about 3 months (around 03/27/2024) for DM with PCP.   68 year old male here for diabetes follow up.  Reports heis doing well.  Hs no concerns.  He denies any side effects with Metformin  and Mounjaro. Has been tolerating them well. Pt is interested in increasing the dose of Mounjaro to help with weight loss.   He does not check blood sugars.   Lab Results      Component                Value               Date                      HGBA1C                   7.0 (H)             04/04/2023           Lab Results      Component                Value               Date                      HGBA1C                   5.7 (A)             12/26/2023                 Review of Systems  All other systems reviewed and are negative.     Objective:     BP 124/76   Pulse 94   Ht 5' 9 (1.753 m)   Wt 227 lb (103 kg)   SpO2 96%   BMI 33.52 kg/m  BP Readings from Last 3 Encounters:  12/26/23 124/76  12/21/23 126/85  11/21/23 100/66      Physical Exam Vitals and nursing note reviewed.  Constitutional:      Appearance: Normal appearance.  HENT:     Head: Normocephalic.     Right Ear: External ear normal.     Left Ear:  External ear normal.  Eyes:     Conjunctiva/sclera: Conjunctivae normal.  Cardiovascular:     Rate and Rhythm: Normal rate.  Pulmonary:     Effort: Pulmonary effort is normal. No respiratory distress.  Abdominal:     Palpations: Abdomen is soft.  Musculoskeletal:  General: Normal range of motion.  Skin:    General: Skin is warm.  Neurological:     Mental Status: He is alert and oriented to person, place, and time.  Psychiatric:        Mood and Affect: Mood normal.      Results for orders placed or performed in visit on 12/26/23  POCT HgB A1C  Result Value Ref Range   Hemoglobin A1C 5.7 (A) 4.0 - 5.6 %    Last hemoglobin A1c Lab Results  Component Value Date   HGBA1C 5.7 (A) 12/26/2023      The 10-year ASCVD risk score (Arnett DK, et al., 2019) is: 34.3%      Brandon K Alpa Salvo, MD

## 2023-12-28 ENCOUNTER — Ambulatory Visit

## 2024-01-01 LAB — COMP. METABOLIC PANEL (12)
AST: 17 IU/L (ref 0–40)
Albumin: 4.2 g/dL (ref 3.9–4.9)
Alkaline Phosphatase: 71 IU/L (ref 47–123)
BUN/Creatinine Ratio: 13 (ref 10–24)
BUN: 12 mg/dL (ref 8–27)
Bilirubin Total: 0.4 mg/dL (ref 0.0–1.2)
Calcium: 9.4 mg/dL (ref 8.6–10.2)
Chloride: 102 mmol/L (ref 96–106)
Creatinine, Ser: 0.92 mg/dL (ref 0.76–1.27)
Globulin, Total: 2.4 g/dL (ref 1.5–4.5)
Glucose: 90 mg/dL (ref 70–99)
Potassium: 4.1 mmol/L (ref 3.5–5.2)
Sodium: 139 mmol/L (ref 134–144)
Total Protein: 6.6 g/dL (ref 6.0–8.5)
eGFR: 89 mL/min/1.73 (ref >59)

## 2024-01-01 LAB — 25-HYDROXY VITAMIN D LCMS D2+D3
25-Hydroxy, Vitamin D-2: <1 ng/mL
25-Hydroxy, Vitamin D-3: 38 ng/mL
25-Hydroxy, Vitamin D: 38 ng/mL

## 2024-01-01 LAB — VITAMIN B12: Vitamin B-12: 281 pg/mL (ref 232–1245)

## 2024-01-01 LAB — SEDIMENTATION RATE: Sed Rate: 16 mm/h (ref 0–30)

## 2024-01-01 LAB — MAGNESIUM: Magnesium: 1.9 mg/dL (ref 1.6–2.3)

## 2024-01-01 LAB — C-REACTIVE PROTEIN: CRP: 4 mg/L (ref 0–10)

## 2024-01-16 DIAGNOSIS — Z961 Presence of intraocular lens: Secondary | ICD-10-CM | POA: Diagnosis not present

## 2024-01-16 DIAGNOSIS — E119 Type 2 diabetes mellitus without complications: Secondary | ICD-10-CM | POA: Diagnosis not present

## 2024-01-16 DIAGNOSIS — Q142 Congenital malformation of optic disc: Secondary | ICD-10-CM | POA: Diagnosis not present

## 2024-01-16 DIAGNOSIS — D3131 Benign neoplasm of right choroid: Secondary | ICD-10-CM | POA: Diagnosis not present

## 2024-01-16 LAB — OPHTHALMOLOGY REPORT-SCANNED

## 2024-01-18 ENCOUNTER — Other Ambulatory Visit: Payer: Self-pay | Admitting: Family Medicine

## 2024-01-18 DIAGNOSIS — E119 Type 2 diabetes mellitus without complications: Secondary | ICD-10-CM

## 2024-01-23 NOTE — Telephone Encounter (Signed)
 Requested medication (s) are due for refill today: yes  Requested medication (s) are on the active medication list: yes  Last refill:  12/26/23  Future visit scheduled: yes  Notes to clinic:  Medication not assigned to a protocol, review manually.      Requested Prescriptions  Pending Prescriptions Disp Refills   MOUNJARO  7.5 MG/0.5ML Pen [Pharmacy Med Name: Mounjaro  7.5 MG/0.5ML Subcutaneous Solution Auto-injector] 4 mL 0    Sig: INJECT 7.5MG  SUBCUTANEOUSLY  ONCE A WEEK     Off-Protocol Failed - 01/23/2024  3:25 PM      Failed - Medication not assigned to a protocol, review manually.      Passed - Valid encounter within last 12 months    Recent Outpatient Visits           4 weeks ago Diabetes mellitus treated with oral medication (HCC)   [place] [facility] Kotturi, Vinay K, MD   2 months ago Primary hypertension   [place] [facility] at [facility], Vinay K, MD   2 months ago Primary osteoarthritis of right knee   [facility], Selinda PARAS, MD   5 months ago Primary osteoarthritis of right knee   [facility] at [facility], Selinda PARAS, MD   6 months ago Essential hypertension   [place] [facility] Cathryne JAYSON, MD       Future Appointments             In 6 months McGowan, Clotilda DELENA RIGGERS [facility] [place]

## 2024-01-23 NOTE — Telephone Encounter (Signed)
 Please review medication refill request

## 2024-01-30 ENCOUNTER — Other Ambulatory Visit: Payer: Self-pay | Admitting: Family Medicine

## 2024-01-30 DIAGNOSIS — M1711 Unilateral primary osteoarthritis, right knee: Secondary | ICD-10-CM

## 2024-01-30 DIAGNOSIS — M1712 Unilateral primary osteoarthritis, left knee: Secondary | ICD-10-CM

## 2024-01-31 NOTE — Telephone Encounter (Signed)
 Requested Prescriptions  Refused Prescriptions Disp Refills   DULoxetine  (CYMBALTA ) 60 MG capsule [Pharmacy Med Name: DULoxetine  HCl 60 MG Oral Capsule Delayed Release Particles] 90 capsule 0    Sig: Take 1 capsule by mouth once daily     Psychiatry: Antidepressants - SNRI - duloxetine  Passed - 01/31/2024  5:25 PM      Passed - Cr in normal range and within 360 days    Creatinine, Ser  Date Value Ref Range Status  12/21/2023 0.92 0.76 - 1.27 mg/dL Final         Passed - eGFR is 30 or above and within 360 days    GFR calc Af Amer  Date Value Ref Range Status  03/14/2019 80 >59 mL/min/1.73 Final   GFR calc non Af Amer  Date Value Ref Range Status  03/14/2019 69 >59 mL/min/1.73 Final   eGFR  Date Value Ref Range Status  12/21/2023 89 >59 mL/min/1.73 Final         Passed - Completed PHQ-2 or PHQ-9 in the last 360 days      Passed - Last BP in normal range    BP Readings from Last 1 Encounters:  12/26/23 124/76         Passed - Valid encounter within last 6 months    Recent Outpatient Visits           1 month ago Diabetes mellitus treated with oral medication (HCC)   [place] [facility] Kotturi, Vinay K, MD   2 months ago Primary hypertension   [place] [facility] at [facility], Vinay K, MD   2 months ago Primary osteoarthritis of right knee   [facility], Selinda PARAS, MD   6 months ago Primary osteoarthritis of right knee   [facility], Selinda PARAS, MD   6 months ago Essential hypertension   [place] [facility] Cathryne JAYSON, MD       Future Appointments             In 6 months McGowan, Clotilda DELENA RIGGERS [facility] [place]

## 2024-02-17 ENCOUNTER — Other Ambulatory Visit: Payer: Self-pay | Admitting: Family Medicine

## 2024-02-17 DIAGNOSIS — E119 Type 2 diabetes mellitus without complications: Secondary | ICD-10-CM

## 2024-02-20 NOTE — Telephone Encounter (Signed)
 Requested Prescriptions  Refused Prescriptions Disp Refills   MOUNJARO  7.5 MG/0.5ML Pen [Pharmacy Med Name: Mounjaro  7.5 MG/0.5ML Subcutaneous Solution Auto-injector] 4 mL 0    Sig: INJECT 7.5 MG UNDER THE SKIN ONCE WEEKLY     Off-Protocol Failed - 02/20/2024 12:43 PM      Failed - Medication not assigned to a protocol, review manually.      Passed - Valid encounter within last 12 months    Recent Outpatient Visits           1 month ago Diabetes mellitus treated with oral medication (HCC)   [place] [facility] Kotturi, Vinay K, MD   3 months ago Primary hypertension   [place] [facility], Vinay K, MD   3 months ago Primary osteoarthritis of right knee   [facility], Selinda PARAS, MD   6 months ago Primary osteoarthritis of right knee   [facility] at [facility], Selinda PARAS, MD   7 months ago Essential hypertension   [place] [facility] Cathryne JAYSON, MD       Future Appointments             In 5 months McGowan, Clotilda DELENA RIGGERS [facility] [place]

## 2024-02-28 ENCOUNTER — Telehealth: Payer: Self-pay | Admitting: Family Medicine

## 2024-02-28 DIAGNOSIS — E119 Type 2 diabetes mellitus without complications: Secondary | ICD-10-CM

## 2024-02-28 MED ORDER — MOUNJARO 7.5 MG/0.5ML ~~LOC~~ SOAJ: 7.5000 mg | SUBCUTANEOUS | 0 refills | Status: DC

## 2024-02-28 NOTE — Telephone Encounter (Signed)
 Please review medication refill request

## 2024-02-28 NOTE — Telephone Encounter (Signed)
 Patient will only have one week left of his MOUNJARO  7.5 MG/0.5ML Pen  will need another refill to his appointment in February.

## 2024-02-28 NOTE — Telephone Encounter (Signed)
"  Refill sent  "

## 2024-02-28 NOTE — Telephone Encounter (Signed)
 Patient aware of refill. Patient verbalized understanding.

## 2024-03-27 ENCOUNTER — Ambulatory Visit: Admitting: Family Medicine

## 2024-03-29 ENCOUNTER — Ambulatory Visit: Admitting: Family Medicine

## 2024-03-29 ENCOUNTER — Encounter: Payer: Self-pay | Admitting: Family Medicine

## 2024-03-29 ENCOUNTER — Other Ambulatory Visit: Payer: Self-pay | Admitting: Family Medicine

## 2024-03-29 VITALS — BP 120/70 | HR 68 | Ht 69.0 in | Wt 227.0 lb

## 2024-03-29 DIAGNOSIS — E119 Type 2 diabetes mellitus without complications: Secondary | ICD-10-CM

## 2024-03-29 DIAGNOSIS — E782 Mixed hyperlipidemia: Secondary | ICD-10-CM

## 2024-03-29 DIAGNOSIS — I10 Essential (primary) hypertension: Secondary | ICD-10-CM

## 2024-03-29 DIAGNOSIS — Z91148 Patient's other noncompliance with medication regimen for other reason: Secondary | ICD-10-CM | POA: Insufficient documentation

## 2024-03-29 MED ORDER — METFORMIN HCL 500 MG PO TABS: 1000.0000 mg | ORAL_TABLET | Freq: Two times a day (BID) | ORAL | 0 refills | Status: AC

## 2024-03-29 MED ORDER — ATORVASTATIN CALCIUM 10 MG PO TABS: 10.0000 mg | ORAL_TABLET | Freq: Every day | ORAL | 1 refills | Status: AC

## 2024-03-29 MED ORDER — LISINOPRIL 2.5 MG PO TABS: 2.5000 mg | ORAL_TABLET | Freq: Every day | ORAL | 1 refills | Status: AC

## 2024-03-29 NOTE — Progress Notes (Signed)
 "  Established Patient Office Visit  Patient ID: Brandon Perez, male    DOB: [birth year]  Age: 69 y.o. MRN: [id number] PCP: Soraya Paquette K, MD  Chief Complaint  Patient presents with   Diabetes    DM.    Subjective:     HPI  Discussed the use of AI scribe software for clinical note transcription with the patient, who gave verbal consent to proceed.  History of Present Illness Brandon Perez is a 69 year old male with hypertension and diabetes who presents with dizziness and medication management issues.  He has been experiencing dizziness, which he attributes to his blood pressure medication. The dizziness began after taking his blood pressure medication, leading him to stop all his medications, including those for blood pressure, cholesterol, and diabetes. The dizziness resolved after discontinuing the medication.  He has been monitoring his blood pressure at home for the past month, noting that it has not been high, typically ranging from 112 to 120 mmHg, but occasionally dropping to 80 mmHg. He currently feels fine and is not experiencing dizziness.  He stopped taking Mounjaro  due to its high cost and a high deductible, despite having lost over 50 pounds while on it. His A1c was previously around 7% but has dropped to 5% after losing weight and stopping the medication. He checks his blood sugar at home, which is usually around 100 mg/dL in the mornings.  He is currently only taking Flomax  and a baby aspirin. He has a supply of metformin  at home but has not been taking it for the past month. He is concerned about the cost of medications and their side effects, stating that he stopped taking them because they made him feel bad.  He has a history of stopping his cholesterol medication due to adverse effects, stating 'nobody gonna take something make me feel bad.'      Review of Systems  All other systems reviewed and are negative.     Objective:     BP 120/70   Pulse 68    Ht 5' 9 (1.753 m)   Wt 227 lb (103 kg)   SpO2 97%   BMI 33.52 kg/m  BP Readings from Last 3 Encounters:  03/29/24 120/70  12/26/23 124/76  12/21/23 126/85   Wt Readings from Last 3 Encounters:  03/29/24 227 lb (103 kg)  12/26/23 227 lb (103 kg)  12/21/23 228 lb (103.4 kg)      Physical Exam Vitals and nursing note reviewed.  Constitutional:      Appearance: Normal appearance.  HENT:     Head: Normocephalic.     Right Ear: External ear normal.     Left Ear: External ear normal.  Eyes:     Conjunctiva/sclera: Conjunctivae normal.  Cardiovascular:     Rate and Rhythm: Normal rate.  Pulmonary:     Effort: Pulmonary effort is normal. No respiratory distress.  Abdominal:     Palpations: Abdomen is soft.  Musculoskeletal:        General: Normal range of motion.  Skin:    General: Skin is warm.  Neurological:     Mental Status: He is alert and oriented to person, place, and time.  Psychiatric:        Mood and Affect: Mood normal.     Physical Exam    No results found for any visits on 03/29/24.  Last hemoglobin A1c Lab Results  Component Value Date   HGBA1C 5.7 (A) 12/26/2023  The 10-year ASCVD risk score (Arnett DK, et al., 2019) is: 32.7%    Assessment & Plan:   Problem List Items Addressed This Visit       Endocrine   Diabetes mellitus treated with oral medication (HCC)    Assessment and Plan Assessment & Plan Type 2 diabetes mellitus A1c at 5.7% after stopping metformin . Significant weight loss improved glycemic control. - Restart metformin  once daily. - Monitor blood sugars regularly. - Discussed dietary modifications to maintain weight loss.  Essential hypertension Dizziness and hypotension resolved after stopping medication. Currently on Flomax  and baby aspirin. - Prescribed low-dose lisinopril  2.5 mg at night to protect kidneys and manage blood pressure. - Monitor blood pressure regularly.  Mixed hyperlipidemia Increased  cardiovascular risk due to discontinuation of cholesterol medication. - Discussed the importance of resuming cholesterol medication to reduce cardiovascular risk. - Prescribed cholesterol medication at a lower dose.  - Refusing LDCT.  No follow-ups on file.    Bellina Tokarczyk K Aldon Hengst, MD [facility] at [facility]   "

## 2024-06-07 ENCOUNTER — Ambulatory Visit

## 2024-07-30 ENCOUNTER — Ambulatory Visit: Admitting: Urology

## 2024-08-06 ENCOUNTER — Ambulatory Visit: Admitting: Urology

## 2024-09-20 ENCOUNTER — Ambulatory Visit: Admitting: Family Medicine

## 2024-09-27 ENCOUNTER — Ambulatory Visit: Admitting: Family Medicine
